# Patient Record
Sex: Male | Born: 1985 | Race: White | Hispanic: No | Marital: Married | Smoking: Never smoker
Health system: Southern US, Community
[De-identification: ages and names within clinical notes are randomized; demographics above are authoritative.]

## PROBLEM LIST (undated history)

## (undated) DIAGNOSIS — K649 Unspecified hemorrhoids: Secondary | ICD-10-CM

## (undated) DIAGNOSIS — I10 Essential (primary) hypertension: Secondary | ICD-10-CM

## (undated) DIAGNOSIS — N200 Calculus of kidney: Secondary | ICD-10-CM

## (undated) HISTORY — PX: VASECTOMY: SHX75

## (undated) HISTORY — PX: LITHOTRIPSY: SUR834

## (undated) HISTORY — PX: MULTIPLE TOOTH EXTRACTIONS: SHX2053

---

## 2013-05-30 ENCOUNTER — Encounter (HOSPITAL_BASED_OUTPATIENT_CLINIC_OR_DEPARTMENT_OTHER): Payer: Self-pay | Admitting: *Deleted

## 2013-05-30 ENCOUNTER — Emergency Department (HOSPITAL_BASED_OUTPATIENT_CLINIC_OR_DEPARTMENT_OTHER): Payer: BC Managed Care – PPO

## 2013-05-30 ENCOUNTER — Emergency Department (HOSPITAL_BASED_OUTPATIENT_CLINIC_OR_DEPARTMENT_OTHER)
Admission: EM | Admit: 2013-05-30 | Discharge: 2013-05-30 | Disposition: A | Discharge status: Home / Self Care | Payer: BC Managed Care – PPO | Attending: Emergency Medicine | Admitting: Emergency Medicine

## 2013-05-30 DIAGNOSIS — R220 Localized swelling, mass and lump, head: Secondary | ICD-10-CM | POA: Insufficient documentation

## 2013-05-30 DIAGNOSIS — H53149 Visual discomfort, unspecified: Secondary | ICD-10-CM | POA: Insufficient documentation

## 2013-05-30 DIAGNOSIS — R51 Headache: Secondary | ICD-10-CM | POA: Insufficient documentation

## 2013-05-30 DIAGNOSIS — J019 Acute sinusitis, unspecified: Secondary | ICD-10-CM | POA: Insufficient documentation

## 2013-05-30 DIAGNOSIS — H5789 Other specified disorders of eye and adnexa: Secondary | ICD-10-CM | POA: Insufficient documentation

## 2013-05-30 MED ORDER — TRAMADOL HCL 50 MG PO TABS: 50.0000 mg | ORAL_TABLET | Freq: Four times a day (QID) | ORAL | Status: DC | PRN

## 2013-05-30 MED ORDER — AMOXICILLIN-POT CLAVULANATE 875-125 MG PO TABS: 1.0000 | ORAL_TABLET | Freq: Two times a day (BID) | ORAL | Status: DC

## 2013-05-30 NOTE — ED Provider Notes (Signed)
History    This chart was scribed for Glenn Jakes, MD by Quintella Reichert, ED scribe.  This patient was seen in room MH03/MH03 and the patient's care was started at 3:26 PM.   CSN: [id number]  Arrival date & time 05/30/13  1419      Chief Complaint  Patient presents with  . Eye Pain     Patient is a 27 y.o. male presenting with eye pain. The history is provided by the patient. No language interpreter was used.  Eye Pain This is a new problem. The current episode started more than 2 days ago. The problem occurs constantly. Associated symptoms include headaches. Pertinent negatives include no chest pain, no abdominal pain and no shortness of breath. Nothing aggravates the symptoms. Nothing relieves the symptoms. Treatments tried: ibuprofen. The treatment provided no relief.    HPI Comments: Glenn Johnson is a 27 y.o. male who presents to the Emergency Department complaining of constant, moderate left eye pain that began 2-3 days ago, with accompanying left forehead tenderness, mild photophobia, and intermittent mild headache.  Pt describes pain as "like someone is poking it from behind the eye," at a severity of 7/10.  He denies injury to the eye or head, or any other illness or injury that may have caused pain.  He also notes that left forehead is tender to light touch.   He states that pain initially presented with a headache and he thought that eye pain was associated, but that headache resolved with ibuprofen while eye pain persisted.  He states that he woke up this morning with visible drooping and swelling to the left eyelid.  He also notes mild photophobia in the eye, with associated mild discharge when exposed to sun.  Pt went to [facility] this morning and had eye numbed and stained, and was informed that cornea was normal and advised to come to ED for further evaluation.  Pt does not wear glasses or contacts.  He notes that he takes allergy medication for seasonal allergies but  presently denies nasal or sinus congestion.  Pt denies fever, chills, visual disturbances, cough, rhinorrhea, CP, SOB, difficulty breathing, abdominal pain, nausea, emesis, diarrhea, neck pain or stiffness, back pain, dysuria, swelling in legs, rash, or any other associated symptoms.  Pt denies h/o bleeding easily.  He notes that he had teeth extracted in January 2014 and that one tooth was in his left nasal cavity.     History reviewed. No pertinent past medical history.  History reviewed. No pertinent past surgical history.  No family history on file.  History  Substance Use Topics  . Smoking status: Never Smoker   . Smokeless tobacco: Never Used  . Alcohol Use: Not on file      Review of Systems  Constitutional: Negative for fever and chills.  HENT: Positive for facial swelling (left eyelid). Negative for rhinorrhea, neck pain and neck stiffness.   Eyes: Positive for photophobia, pain and discharge (when exposed to sun). Negative for visual disturbance.  Respiratory: Negative for cough and shortness of breath.   Cardiovascular: Negative for chest pain and leg swelling.  Gastrointestinal: Negative for nausea, vomiting, abdominal pain and diarrhea.  Genitourinary: Negative for dysuria.  Musculoskeletal: Negative for back pain.  Skin: Negative for rash.  Neurological: Positive for headaches.  Psychiatric/Behavioral: Negative for confusion.    Allergies  Review of patient's allergies indicates no known allergies.  Home Medications   Current Outpatient Rx  Name  Route  Sig  Dispense  Refill  . cetirizine (ZYRTEC) 10 MG tablet   Oral   Take 10 mg by mouth daily.         Marland Kitchen amoxicillin-clavulanate (AUGMENTIN) 875-125 MG per tablet   Oral   Take 1 tablet by mouth every 12 (twelve) hours.   20 tablet   0   . traMADol (ULTRAM) 50 MG tablet   Oral   Take 1 tablet (50 mg total) by mouth every 6 (six) hours as needed for pain.   15 tablet   0     BP 152/75  Pulse 79   Temp(Src) 98.3 F (36.8 C) (Oral)  Resp 18  Ht 5\' 6"  (1.676 m)  Wt 305 lb (138.347 kg)  BMI 49.25 kg/m2  SpO2 97%  Physical Exam  Nursing note and vitals reviewed. Constitutional: He is oriented to person, place, and time.  HENT:  Head: Normocephalic and atraumatic.  No maxillary sinus tenderness Left frontal sinus tenderness  Eyes: Conjunctivae and EOM are normal. Pupils are equal, round, and reactive to light. Right eye exhibits no discharge. Left eye exhibits no discharge. No scleral icterus.  Sclera are clear.  Eyes track appropriately. Anterior chamber and pupil reaction are normal on the right side Anterior chamber and pupil reaction are normal on the left side  Neck: Normal range of motion. Neck supple. No tracheal deviation present.  Cardiovascular: Normal rate and regular rhythm.   No murmur heard. Pulmonary/Chest: Effort normal and breath sounds normal. No respiratory distress. He has no wheezes. He has no rales.  Abdominal: Soft. There is no tenderness.  Lymphadenopathy:    He has no cervical adenopathy.  Neurological: He is alert and oriented to person, place, and time. No cranial nerve deficit.  Skin: Skin is warm and dry. No rash noted.  Psychiatric: He has a normal mood and affect. His behavior is normal.    ED Course  Procedures (including critical care time)  DIAGNOSTIC STUDIES: Oxygen Saturation is 97% on room air, normal by my interpretation.    COORDINATION OF CARE: 3:34 PM-Informed pt that symptoms are likely due to frontal sinus.  Discussed treatment plan which includes CT-scan with pt at bedside and pt agreed to plan.      Labs Reviewed - No data to display Ct Head Wo Contrast  05/30/2013   *RADIOLOGY REPORT*  Clinical Data:  Headache with left facial pressure and eye swelling.  No acute injury.  CT HEAD WITHOUT CONTRAST CT MAXILLOFACIAL WITHOUT CONTRAST  Technique:  Multidetector CT imaging of the head and maxillofacial structures were performed  using the standard protocol without intravenous contrast. Multiplanar CT image reconstructions of the maxillofacial structures were also generated.  Comparison:   None.  CT HEAD  Findings: There is no evidence of acute intracranial hemorrhage, mass lesion, brain edema or extra-axial fluid collection.  The ventricles and subarachnoid spaces are appropriately sized for age. There is no CT evidence of acute cortical infarction.  The left maxillary, ethmoid and frontal sinuses are opacified without air fluid levels.  The mastoids and middle ears are clear. The calvarium is intact.  IMPRESSION:  1.  No acute intracranial findings. 2.  Left paranasal sinus opacification - see below.  CT MAXILLOFACIAL  Findings:   As above, there is near complete opacification of the left frontal, anterior ethmoid and maxillary sinuses.  There is thickening in the left middle turbinate.  The sphenoid and right paranasal sinuses are clear.  There are no air-fluid levels.  There is an osseous  defect in the alveolar ridge adjacent to absent left maxillary first and second bicuspids (teeth 12 and 13). There appear to be some to fragments displaced into the inferior aspect of the maxillary sinus.  No other significant periodontal disease is identified.  There is no evidence of acute fracture or dislocation.  No orbital soft tissue swelling or globe abnormality is identified.  IMPRESSION:  1.  Opacification of the left maxillary, ethmoid and frontal sinuses adjacent to an osseous defect in the left maxillary alveolar ridge. The left maxillary first and second bicuspids are absent - question previous tooth removal. Findings are suspicious for odontogenic sinusitis. 2.  No orbital abnormalities identified.   Original Report Authenticated By: Carey Bullocks, M.D.   Ct Maxillofacial Wo Cm  05/30/2013   *RADIOLOGY REPORT*  Clinical Data:  Headache with left facial pressure and eye swelling.  No acute injury.  CT HEAD WITHOUT CONTRAST CT  MAXILLOFACIAL WITHOUT CONTRAST  Technique:  Multidetector CT imaging of the head and maxillofacial structures were performed using the standard protocol without intravenous contrast. Multiplanar CT image reconstructions of the maxillofacial structures were also generated.  Comparison:   None.  CT HEAD  Findings: There is no evidence of acute intracranial hemorrhage, mass lesion, brain edema or extra-axial fluid collection.  The ventricles and subarachnoid spaces are appropriately sized for age. There is no CT evidence of acute cortical infarction.  The left maxillary, ethmoid and frontal sinuses are opacified without air fluid levels.  The mastoids and middle ears are clear. The calvarium is intact.  IMPRESSION:  1.  No acute intracranial findings. 2.  Left paranasal sinus opacification - see below.  CT MAXILLOFACIAL  Findings:   As above, there is near complete opacification of the left frontal, anterior ethmoid and maxillary sinuses.  There is thickening in the left middle turbinate.  The sphenoid and right paranasal sinuses are clear.  There are no air-fluid levels.  There is an osseous defect in the alveolar ridge adjacent to absent left maxillary first and second bicuspids (teeth 12 and 13). There appear to be some to fragments displaced into the inferior aspect of the maxillary sinus.  No other significant periodontal disease is identified.  There is no evidence of acute fracture or dislocation.  No orbital soft tissue swelling or globe abnormality is identified.  IMPRESSION:  1.  Opacification of the left maxillary, ethmoid and frontal sinuses adjacent to an osseous defect in the left maxillary alveolar ridge. The left maxillary first and second bicuspids are absent - question previous tooth removal. Findings are suspicious for odontogenic sinusitis. 2.  No orbital abnormalities identified.   Original Report Authenticated By: Carey Bullocks, M.D.     1. Acute sinusitis       MDM  Clinically  patient's symptoms were highly suspicious for left frontal sinusitis. CT scan shows left paranasal sinusitis to include the frontal area. Clinically the patient has no anterior eye pain anterior chamber was normal at the urgent care they ruled out any corneal abrasion. Patient's extraocular muscles were intact no evidence of orbital cellulitis. No redness around the eye. Will treat as if it's a moderate to severe sinusitis with antibiotics for 10 days. Symptoms not consistent with acute glaucoma.     I personally performed the services described in this documentation, which was scribed in my presence. The recorded information has been reviewed and is accurate.     Glenn Jakes, MD 05/30/13 [phone]

## 2013-05-30 NOTE — ED Notes (Signed)
C/o pain behind left eye x 3 days- seen at [facility] today and had exam with blacklight- sent here for further eval

## 2017-05-02 ENCOUNTER — Encounter: Payer: Self-pay | Admitting: *Deleted

## 2017-05-02 ENCOUNTER — Emergency Department
Admission: EM | Admit: 2017-05-02 | Discharge: 2017-05-02 | Disposition: A | Discharge status: Home / Self Care | Payer: BLUE CROSS/BLUE SHIELD | Source: Home / Self Care | Attending: Family Medicine | Admitting: Family Medicine

## 2017-05-02 ENCOUNTER — Emergency Department (INDEPENDENT_AMBULATORY_CARE_PROVIDER_SITE_OTHER): Payer: BLUE CROSS/BLUE SHIELD

## 2017-05-02 DIAGNOSIS — N433 Hydrocele, unspecified: Secondary | ICD-10-CM | POA: Diagnosis not present

## 2017-05-02 DIAGNOSIS — N5089 Other specified disorders of the male genital organs: Secondary | ICD-10-CM

## 2017-05-02 DIAGNOSIS — N50812 Left testicular pain: Secondary | ICD-10-CM

## 2017-05-02 DIAGNOSIS — N451 Epididymitis: Secondary | ICD-10-CM

## 2017-05-02 HISTORY — DX: Calculus of kidney: N20.0

## 2017-05-02 LAB — POCT URINALYSIS DIP (MANUAL ENTRY)
Bilirubin, UA: NEGATIVE
Blood, UA: NEGATIVE
Glucose, UA: NEGATIVE mg/dL
Leukocytes, UA: NEGATIVE
Nitrite, UA: NEGATIVE
Spec Grav, UA: 1.02 (ref 1.010–1.025)
Urobilinogen, UA: 0.2 E.U./dL
pH, UA: 6.5 (ref 5.0–8.0)

## 2017-05-02 MED ORDER — IBUPROFEN 800 MG PO TABS
800.0000 mg | ORAL_TABLET | Freq: Once | ORAL | Status: AC
  Administered 2017-05-02: 800 mg via ORAL

## 2017-05-02 MED ORDER — CEFTRIAXONE SODIUM 1 G IJ SOLR
1.0000 g | Freq: Once | INTRAMUSCULAR | Status: AC
  Administered 2017-05-02: 1 g via INTRAMUSCULAR

## 2017-05-02 MED ORDER — DOXYCYCLINE HYCLATE 100 MG PO CAPS: 100.0000 mg | ORAL_CAPSULE | Freq: Two times a day (BID) | ORAL | 0 refills | Status: DC

## 2017-05-02 NOTE — ED Provider Notes (Signed)
CSN: [id number]     Arrival date & time 05/02/17  0912 History   First MD Initiated Contact with Patient 05/02/17 [phone]     Chief Complaint  Patient presents with  . Groin Swelling  . Pelvic Pain   (Consider location/radiation/quality/duration/timing/severity/associated sxs/prior Treatment) HPI  Glenn Johnson is a 31 y.o. male presenting to UC with c/o gradually worsening Left side testicular pain and swelling for about 3 days.  He also felt a knot in his testicle.  Associated intermittent Left pelvic pain radiating to his Left flank.  He had a vasectomy in 01/2017 and a renal stent placed followed by lithotripsy last month for a Right sided kidney stone.  He reports making a full recovery after those procedures, no complications. He does not known where current symptoms came from. No recent heavy lifting. No hx of hernias. Denies concern for STIs. Sitting or certain positions and touch make the pain worse.    Past Medical History:  Diagnosis Date  . Kidney stone    Past Surgical History:  Procedure Laterality Date  . VASECTOMY     Family History  Problem Relation Age of Onset  . Hypertension Mother    Social History  Substance Use Topics  . Smoking status: Never Smoker  . Smokeless tobacco: Never Used  . Alcohol use Not on file    Review of Systems  Constitutional: Negative for chills and fever.  Gastrointestinal: Positive for abdominal pain (Left lower ). Negative for diarrhea, nausea and vomiting.  Genitourinary: Positive for flank pain (Left, mild intermittent), scrotal swelling and testicular pain (Left side). Negative for decreased urine volume, discharge, dysuria, frequency, genital sores, hematuria, penile pain, penile swelling and urgency.    Allergies  Patient has no known allergies.  Home Medications   Prior to Admission medications   Medication Sig Start Date End Date Taking? Authorizing Provider  cetirizine (ZYRTEC) 10 MG tablet Take 10 mg by mouth daily.     Historical Provider, MD  doxycycline (VIBRAMYCIN) 100 MG capsule Take 1 capsule (100 mg total) by mouth 2 (two) times daily. One po bid x 10 days 05/02/17   Junius Finner, PA-C   Meds Ordered and Administered this Visit   Medications  ibuprofen (ADVIL,MOTRIN) tablet 800 mg (800 mg Oral Given 05/02/17 0953)  cefTRIAXone (ROCEPHIN) injection 1 g (1 g Intramuscular Given 05/02/17 1110)    BP 140/81 (BP Location: Left Arm)   Pulse 97   Temp 98.5 F (36.9 C) (Oral)   Resp 16   Wt (!) 320 lb (145.2 kg)   SpO2 98%   BMI 51.65 kg/m  No data found.   Physical Exam  Constitutional: He is oriented to person, place, and time. He appears well-developed and well-nourished.  HENT:  Head: Normocephalic and atraumatic.  Eyes: EOM are normal.  Neck: Normal range of motion.  Cardiovascular: Normal rate.   Pulmonary/Chest: Effort normal.  Genitourinary: Penis normal. Left testis shows mass, swelling ( mild) and tenderness. No discharge found.  Genitourinary Comments: Chaperoned exam.  Musculoskeletal: Normal range of motion.  Neurological: He is alert and oriented to person, place, and time.  Skin: Skin is warm and dry.  Psychiatric: He has a normal mood and affect. His behavior is normal.  Nursing note and vitals reviewed.   Urgent Care Course     Procedures (including critical care time)  Labs Review Labs Reviewed  POCT URINALYSIS DIP (MANUAL ENTRY) - Abnormal; Notable for the following:       Result  Value   Ketones, POC UA small (15) (*)    Protein Ur, POC trace (*)    All other components within normal limits    Imaging Review [place] Scrotum  Result Date: 05/02/2017 CLINICAL DATA:  31 year old presenting with a 3 day history of left scrotal pain and swelling. Prior vasectomy in February, 2018. EXAM: SCROTAL ULTRASOUND DOPPLER ULTRASOUND OF THE TESTICLES TECHNIQUE: Complete ultrasound examination of the testicles, epididymis, and other scrotal structures was performed. Color and spectral  Doppler ultrasound were also utilized to evaluate blood flow to the testicles. COMPARISON:  None. FINDINGS: Right testicle Measurements: Approximately 4.1 x 2.3 x 2.7 cm. No testicular mass. Testicular microlithiasis. Left testicle Measurements: Approximately 3.8 x 2.4 x 2.8 cm. No testicular mass. Testicular microlithiasis. Right epididymis: Diffusely enlarged with mild hyperemia on color Doppler evaluation. Left epididymis: Diffusely enlarged with hyperemia on color Doppler evaluation. Hydrocele:  Moderate sized left hydrocele and small right hydrocele. Varicocele:  None visualized. Pulsed Doppler interrogation of both testes demonstrates normal low resistance arterial and venous waveforms bilaterally. IMPRESSION: 1. Bilateral epididymitis, left greater than right. 2. Bilateral hydroceles, moderate sized on the left and small on the right. 3. Bilateral testicular microlithiasis. Current literature suggests that testicular microlithiasis is not a significant independent risk factor for development of testicular carcinoma, and that follow up imaging is not warranted in the absence of other risk factors. Monthly testicular self-examination and annual physical exams are considered appropriate surveillance. If patient has other risk factors for testicular carcinoma, then referral to Urology should be considered. (Reference: DeCastro, et al.: A 5-Year Follow up Study of Asymptomatic Men with Testicular Microlithiasis. J Urol 2008; 179:1420-1423.) Electronically Signed   By: Hulan Saashomas  Lawrence M.D.   On: 05/02/2017 10:51   [place] Art/ven Flow Abd Pelv Doppler  Result Date: 05/02/2017 CLINICAL DATA:  31 year old presenting with a 3 day history of left scrotal pain and swelling. Prior vasectomy in February, 2018. EXAM: SCROTAL ULTRASOUND DOPPLER ULTRASOUND OF THE TESTICLES TECHNIQUE: Complete ultrasound examination of the testicles, epididymis, and other scrotal structures was performed. Color and spectral Doppler ultrasound  were also utilized to evaluate blood flow to the testicles. COMPARISON:  None. FINDINGS: Right testicle Measurements: Approximately 4.1 x 2.3 x 2.7 cm. No testicular mass. Testicular microlithiasis. Left testicle Measurements: Approximately 3.8 x 2.4 x 2.8 cm. No testicular mass. Testicular microlithiasis. Right epididymis: Diffusely enlarged with mild hyperemia on color Doppler evaluation. Left epididymis: Diffusely enlarged with hyperemia on color Doppler evaluation. Hydrocele:  Moderate sized left hydrocele and small right hydrocele. Varicocele:  None visualized. Pulsed Doppler interrogation of both testes demonstrates normal low resistance arterial and venous waveforms bilaterally. IMPRESSION: 1. Bilateral epididymitis, left greater than right. 2. Bilateral hydroceles, moderate sized on the left and small on the right. 3. Bilateral testicular microlithiasis. Current literature suggests that testicular microlithiasis is not a significant independent risk factor for development of testicular carcinoma, and that follow up imaging is not warranted in the absence of other risk factors. Monthly testicular self-examination and annual physical exams are considered appropriate surveillance. If patient has other risk factors for testicular carcinoma, then referral to Urology should be considered. (Reference: DeCastro, et al.: A 5-Year Follow up Study of Asymptomatic Men with Testicular Microlithiasis. J Urol 2008; 179:1420-1423.) Electronically Signed   By: Hulan Saashomas  Lawrence M.D.   On: 05/02/2017 10:51      MDM   1. Bilateral epididymitis   2. Left testicular pain   3. Hydrocele in adult   4. Testicular microlithiasis  Pt c/o Left side testicular pain with swelling.  U/S c/w bilateral epididymitis, hydrocele, and testicular microlithiasis.   Rocephin 1g IM in UC Rx: doxycycline 100mg  BID for 10 days Encouraged f/u with his urologist given recent procedure.    Junius Finner, PA-C 05/02/17 1123

## 2017-05-02 NOTE — ED Triage Notes (Signed)
Patient c/o gradually worsening left testicular pain and swelling x 3 days. He believes there is a knot in the testicle. He later developed intermittent left pelvic pain radiating to his left flank. He had a vasectomy in 01/2027 and a lithotripsy last month. Taken IBF

## 2017-05-04 ENCOUNTER — Telehealth: Payer: Self-pay | Admitting: Emergency Medicine

## 2017-05-04 NOTE — Telephone Encounter (Signed)
Pt states that he is feeling a lot better.  Will continue with current regimen, any further problems, will f/u w/PCP.  TMartin,CMA

## 2017-07-14 ENCOUNTER — Emergency Department (INDEPENDENT_AMBULATORY_CARE_PROVIDER_SITE_OTHER)
Admission: EM | Admit: 2017-07-14 | Discharge: 2017-07-14 | Disposition: A | Discharge status: Home / Self Care | Payer: BLUE CROSS/BLUE SHIELD | Source: Home / Self Care | Attending: Family Medicine | Admitting: Family Medicine

## 2017-07-14 DIAGNOSIS — Z113 Encounter for screening for infections with a predominantly sexual mode of transmission: Secondary | ICD-10-CM

## 2017-07-14 NOTE — ED Provider Notes (Signed)
[facility]    CSN: [id number] Arrival date & time: 07/14/17  0848     History   Chief Complaint Chief Complaint  Patient presents with  . STI testing    HPI Glenn Johnson is a 31 y.o. male.   Patient presents for STD testing.  He assymptomatic and denies possible exposure.  He has never had testing, and never had an STD.   The history is provided by the patient.    Past Medical History:  Diagnosis Date  . Kidney stone     There are no active problems to display for this patient.   Past Surgical History:  Procedure Laterality Date  . LITHOTRIPSY    . MULTIPLE TOOTH EXTRACTIONS    . VASECTOMY         Home Medications    Prior to Admission medications   Medication Sig Start Date End Date Taking? Authorizing Provider  cetirizine (ZYRTEC) 10 MG tablet Take 10 mg by mouth daily.    [provider]  doxycycline (VIBRAMYCIN) 100 MG capsule Take 1 capsule (100 mg total) by mouth 2 (two) times daily. One po bid x 10 days 05/02/17   Lurene ShadowPhelps, Erin O, PA-C    Family History Family History  Problem Relation Age of Onset  . Hypertension Mother   . Hypertension Father     Social History Social History  Substance Use Topics  . Smoking status: Never Smoker  . Smokeless tobacco: Never Used  . Alcohol use Not on file     Allergies   Other   Review of Systems Review of Systems  Constitutional: Negative for activity change, appetite change, chills, diaphoresis, fatigue and fever.  HENT: Negative.   Eyes: Negative.   Respiratory: Negative.   Cardiovascular: Negative.   Gastrointestinal: Negative.   Genitourinary: Negative for discharge, dysuria, frequency, genital sores, hematuria, penile pain, scrotal swelling, testicular pain and urgency.  Musculoskeletal: Negative for arthralgias and joint swelling.  Skin: Negative for rash.  All other systems reviewed and are negative.    Physical Exam Triage Vital Signs ED Triage Vitals  [07/14/17 0907]  Enc Vitals Group     BP 113/77     Pulse Rate 85     Resp      Temp 98.5 F (36.9 C)     Temp Source Oral     SpO2 97 %     Weight (!) 316 lb (143.3 kg)     Height 5\' 6"  (1.676 m)     Head Circumference      Peak Flow      Pain Score      Pain Loc      Pain Edu?      Excl. in GC?    No data found.   Updated Vital Signs BP 113/77 (BP Location: Left Arm)   Pulse 85   Temp 98.5 F (36.9 C) (Oral)   Ht 5\' 6"  (1.676 m)   Wt (!) 316 lb (143.3 kg)   SpO2 97%   BMI 51.00 kg/m   Visual Acuity Right Eye Distance:   Left Eye Distance:   Bilateral Distance:    Right Eye Near:   Left Eye Near:    Bilateral Near:     Physical Exam Nursing notes and Vital Signs reviewed. Appearance:  Patient appears stated age, and in no acute distress.    Eyes:  Pupils are equal, round, and reactive to light and accomodation.  Extraocular movement is intact.  Conjunctivae are  not inflamed   Lungs:   Normal respiration Heart:  Normal rate. Skin:  No rash present.     UC Treatments / Results  Labs (all labs ordered are listed, but only abnormal results are displayed) Labs Reviewed  GC/CHLAMYDIA PROBE AMP  HIV ANTIBODY (ROUTINE TESTING)  RPR  HSV(HERPES SIMPLEX VRS) I + II AB-IGG    EKG  EKG Interpretation None       Radiology No results found.  Procedures Procedures (including critical care time)  Medications Ordered in UC Medications - No data to display   Initial Impression / Assessment and Plan / UC Course  I have reviewed the triage vital signs and the nursing notes.  Pertinent labs & imaging results that were available during my care of the patient were reviewed by me and considered in my medical decision making (see chart for details).    GC/chlamydia, RPR, HIV antibody, HSV antibodies pending.  Final Clinical Impressions(s) / UC Diagnoses   Final diagnoses:  Screening for STD (sexually transmitted disease)    New Prescriptions New  Prescriptions   No medications on file     Lattie Haw, MD 07/14/17 [phone]

## 2017-07-14 NOTE — ED Triage Notes (Signed)
Pt is not having any symptoms, and no concerns, Just has never had testing done, and would like to have STI test.

## 2017-07-14 NOTE — ED Notes (Signed)
Number to call---[phone] Password---love pain

## 2017-07-15 ENCOUNTER — Telehealth: Payer: Self-pay | Admitting: *Deleted

## 2017-07-15 LAB — HIV ANTIBODY (ROUTINE TESTING W REFLEX): HIV 1&2 Ab, 4th Generation: NONREACTIVE

## 2017-07-15 LAB — HSV(HERPES SIMPLEX VRS) I + II AB-IGG
HSV 1 Glycoprotein G Ab, IgG: <0.9 Index (ref <0.90)
HSV 2 Glycoprotein G Ab, IgG: <0.9 Index (ref <0.90)

## 2017-07-15 LAB — GC/CHLAMYDIA PROBE AMP
CT PROBE, AMP APTIMA: NOT DETECTED
GC Probe RNA: NOT DETECTED

## 2017-07-15 LAB — RPR

## 2017-07-15 NOTE — Telephone Encounter (Signed)
Callback: patient stated password. Results given.

## 2017-11-14 ENCOUNTER — Encounter: Payer: Self-pay | Admitting: Emergency Medicine

## 2017-11-14 ENCOUNTER — Other Ambulatory Visit: Payer: Self-pay

## 2017-11-14 ENCOUNTER — Emergency Department
Admission: EM | Admit: 2017-11-14 | Discharge: 2017-11-14 | Disposition: A | Discharge status: Home / Self Care | Payer: BLUE CROSS/BLUE SHIELD | Source: Home / Self Care | Attending: Family Medicine | Admitting: Family Medicine

## 2017-11-14 DIAGNOSIS — K644 Residual hemorrhoidal skin tags: Secondary | ICD-10-CM

## 2017-11-14 HISTORY — DX: Unspecified hemorrhoids: K64.9

## 2017-11-14 MED ORDER — HYDROCORTISONE ACETATE 25 MG RE SUPP: 25.0000 mg | Freq: Two times a day (BID) | RECTAL | 0 refills | Status: DC

## 2017-11-14 NOTE — Discharge Instructions (Signed)
°  You may try the suppository prescribed today as well as Sitz baths at home to help with your discomfort.   Over the counter you may find temporary relief with Preparation-H or witch hazel wipes after having a bowel movement.  You may also want to try a stool softener if you feel you are straining to have a bowel movement. Be sure to stay well hydrated.

## 2017-11-14 NOTE — ED Provider Notes (Signed)
[facility]    CSN: [id number] Arrival date & time: 11/14/17  1025     History   Chief Complaint Chief Complaint  Patient presents with  . Hemorrhoids    HPI Glenn Johnson is a 31 y.o. male.   HPI Glenn Johnson is a 31 y.o. male presenting to UC with c/o about 3 days of worsening pain and irritation of his hemorrhoids.  He reports having a small hemorrhoid last year, he saw his PCP who prescribed topical hydrocortisone cream 2.5%.  He states the initial hemorrhoid resolved within 1-2 days but current hemorrhoid only seems to be getting larger and more painful.  He does not feel like he is straining to have a BM but states he only has a BM every other day due to a weight loss diet he is on. denies bleeding. Denies abdominal pain, back pain, fever or chills. Denies urinary symptoms. He was unable to f/u with his PCP today.     Past Medical History:  Diagnosis Date  . Hemorrhoids   . Kidney stone     There are no active problems to display for this patient.   Past Surgical History:  Procedure Laterality Date  . LITHOTRIPSY    . MULTIPLE TOOTH EXTRACTIONS    . VASECTOMY         Home Medications    Prior to Admission medications   Medication Sig Start Date End Date Taking? Authorizing Provider  hydrocortisone (ANUSOL-HC) 2.5 % rectal cream Place 1 application 2 (two) times daily rectally.   Yes [provider]  cetirizine (ZYRTEC) 10 MG tablet Take 10 mg by mouth daily.    [provider]  doxycycline (VIBRAMYCIN) 100 MG capsule Take 1 capsule (100 mg total) by mouth 2 (two) times daily. One po bid x 10 days 05/02/17   Lurene ShadowPhelps, Joniyah Mallinger O, PA-C  hydrocortisone (ANUSOL-HC) 25 MG suppository Place 1 suppository (25 mg total) 2 (two) times daily rectally. For 7 days 11/14/17   Rolla PlatePhelps, Melissaann Dizdarevic O, PA-C    Family History Family History  Problem Relation Age of Onset  . Hypertension Mother   . Hypertension Father     Social History Social History    Tobacco Use  . Smoking status: Never Smoker  . Smokeless tobacco: Never Used  Substance Use Topics  . Alcohol use: Not on file  . Drug use: No     Allergies   Other   Review of Systems Review of Systems  Constitutional: Negative for chills and fever.  Gastrointestinal: Positive for rectal pain. Negative for abdominal pain, blood in stool, constipation, diarrhea, nausea and vomiting.  Musculoskeletal: Negative for back pain.     Physical Exam Triage Vital Signs ED Triage Vitals  Enc Vitals Group     BP 11/14/17 1043 127/81     Pulse Rate 11/14/17 1043 74     Resp 11/14/17 1043 16     Temp 11/14/17 1043 98.5 F (36.9 C)     Temp Source 11/14/17 1043 Oral     SpO2 11/14/17 1043 99 %     Weight 11/14/17 1044 282 lb (127.9 kg)     Height 11/14/17 1044 5\' 6"  (1.676 m)     Head Circumference --      Peak Flow --      Pain Score 11/14/17 1044 5     Pain Loc --      Pain Edu? --      Excl. in GC? --  No data found.  Updated Vital Signs BP 127/81 (BP Location: Right Arm)   Pulse 74   Temp 98.5 F (36.9 C) (Oral)   Resp 16   Ht 5\' 6"  (1.676 m)   Wt 282 lb (127.9 kg)   SpO2 99%   BMI 45.52 kg/m   Visual Acuity Right Eye Distance:   Left Eye Distance:   Bilateral Distance:    Right Eye Near:   Left Eye Near:    Bilateral Near:     Physical Exam  Constitutional: He is oriented to person, place, and time. He appears well-developed and well-nourished. No distress.  HENT:  Head: Normocephalic and atraumatic.  Eyes: EOM are normal.  Neck: Normal range of motion.  Cardiovascular: Normal rate.  Pulmonary/Chest: Effort normal.  Genitourinary:  Genitourinary Comments: Chaperoned exam. 2cm external tender hemorrhoid. No bleeding. Tenderness during digital exam.   Musculoskeletal: Normal range of motion.  Neurological: He is alert and oriented to person, place, and time.  Skin: Skin is warm and dry. He is not diaphoretic.  Psychiatric: He has a normal mood  and affect. His behavior is normal.  Nursing note and vitals reviewed.    UC Treatments / Results  Labs (all labs ordered are listed, but only abnormal results are displayed) Labs Reviewed - No data to display  EKG  EKG Interpretation None       Radiology No results found.  Procedures Procedures (including critical care time)  Medications Ordered in UC Medications - No data to display   Initial Impression / Assessment and Plan / UC Course  I have reviewed the triage vital signs and the nursing notes.  Pertinent labs & imaging results that were available during my care of the patient were reviewed by me and considered in my medical decision making (see chart for details).     Hx and exam c/w external hemorrhoid. Due to worsening symptoms, question if internal hemorrhoid also present Will have pt start using hydrocortisone suppositories and sitz baths.  F/u with PCP, may need referral to general surgery.   Final Clinical Impressions(s) / UC Diagnoses   Final diagnoses:  External hemorrhoid    ED Discharge Orders        Ordered    hydrocortisone (ANUSOL-HC) 25 MG suppository  2 times daily     11/14/17 1103       Controlled Substance Prescriptions [place] Controlled Substance Registry consulted? Not Applicable   Lurene Shadowhelps, Missy Baksh O, PA-C 11/14/17 1210

## 2017-11-14 NOTE — ED Triage Notes (Signed)
Patient reports hemorrhoidal pain for past few days; no bleeding; having regular BMs; doing weight loss program; using rx given to him for previous episode same problem.

## 2018-11-13 ENCOUNTER — Other Ambulatory Visit: Payer: Self-pay

## 2018-11-13 ENCOUNTER — Emergency Department
Admission: EM | Admit: 2018-11-13 | Discharge: 2018-11-13 | Disposition: A | Discharge status: Home / Self Care | Payer: BLUE CROSS/BLUE SHIELD | Source: Home / Self Care | Attending: Family Medicine | Admitting: Family Medicine

## 2018-11-13 ENCOUNTER — Encounter: Payer: Self-pay | Admitting: *Deleted

## 2018-11-13 DIAGNOSIS — J069 Acute upper respiratory infection, unspecified: Secondary | ICD-10-CM | POA: Diagnosis not present

## 2018-11-13 DIAGNOSIS — B9789 Other viral agents as the cause of diseases classified elsewhere: Secondary | ICD-10-CM

## 2018-11-13 MED ORDER — BENZONATATE 100 MG PO CAPS: 100.0000 mg | ORAL_CAPSULE | Freq: Three times a day (TID) | ORAL | 0 refills | Status: DC

## 2018-11-13 MED ORDER — AZITHROMYCIN 250 MG PO TABS: 250.0000 mg | ORAL_TABLET | Freq: Every day | ORAL | 0 refills | Status: DC

## 2018-11-13 NOTE — Discharge Instructions (Signed)
°  Your symptoms are likely due to a virus such as the common cold, however, if you developing worsening chest congestion with shortness of breath, persistent fever for 3 days, or symptoms not improving in 4-5 days, you may fill the antibiotic (azithromycin).  If you do fill the antibiotic,  please take antibiotics as prescribed and be sure to complete entire course even if you start to feel better to ensure infection does not come back.  Please follow up with family medicine in 1 week if not improving.

## 2018-11-13 NOTE — ED Triage Notes (Signed)
Pt c/o hoarseness, productive cough, and nasal congestion x 2 days. he has taken Nyquil and Cepacol.  Daughter with Laryngitis.

## 2018-11-13 NOTE — ED Provider Notes (Signed)
[facility]    CSN: [id number] Arrival date & time: 11/13/18  1345     History   Chief Complaint Chief Complaint  Patient presents with  . Cough    HPI Glenn Johnson is a 32 y.o. male.   HPI  Glenn Johnson is a 32 y.o. male presenting to UC with c/o hoarse voice and 2 days of productive cough with nasal congestion. Sore chest from coughing.  His daughter is currently being treated with prednisone for laryngitis.  He has taken Nyquil and Cepacol with mild relief. Denies fever, chills, n/v/d.  No hx of asthma.    Past Medical History:  Diagnosis Date  . Hemorrhoids   . Kidney stone     There are no active problems to display for this patient.   Past Surgical History:  Procedure Laterality Date  . LITHOTRIPSY    . MULTIPLE TOOTH EXTRACTIONS    . VASECTOMY         Home Medications    Prior to Admission medications   Medication Sig Start Date End Date Taking? Authorizing Provider  azithromycin (ZITHROMAX) 250 MG tablet Take 1 tablet (250 mg total) by mouth daily. Take first 2 tablets together, then 1 every day until finished. 11/13/18   Lurene Shadow, PA-C  benzonatate (TESSALON) 100 MG capsule Take 1-2 capsules (100-200 mg total) by mouth every 8 (eight) hours. 11/13/18   Lurene Shadow, PA-C    Family History Family History  Problem Relation Age of Onset  . Hypertension Mother   . Hypertension Father     Social History Social History   Tobacco Use  . Smoking status: Never Smoker  . Smokeless tobacco: Never Used  Substance Use Topics  . Alcohol use: Not on file  . Drug use: No     Allergies   Other   Review of Systems Review of Systems  Constitutional: Negative for chills and fever.  HENT: Positive for congestion, postnasal drip, sore throat and voice change. Negative for ear pain and trouble swallowing.   Respiratory: Positive for cough and chest tightness. Negative for shortness of breath.   Cardiovascular: Negative for chest  pain and palpitations.  Gastrointestinal: Negative for abdominal pain, diarrhea, nausea and vomiting.  Musculoskeletal: Negative for arthralgias, back pain and myalgias.  Skin: Negative for rash.     Physical Exam Triage Vital Signs ED Triage Vitals [11/13/18 1423]  Enc Vitals Group     BP (!) 140/98     Pulse Rate 74     Resp 18     Temp 98 F (36.7 C)     Temp Source Oral     SpO2 98 %     Weight 299 lb (135.6 kg)     Height 5\' 6"  (1.676 m)     Head Circumference      Peak Flow      Pain Score 0     Pain Loc      Pain Edu?      Excl. in GC?    No data found.  Updated Vital Signs BP (!) 140/98 (BP Location: Right Arm)   Pulse 74   Temp 98 F (36.7 C) (Oral)   Resp 18   Ht 5\' 6"  (1.676 m)   Wt 299 lb (135.6 kg)   SpO2 98%   BMI 48.26 kg/m   Visual Acuity Right Eye Distance:   Left Eye Distance:   Bilateral Distance:    Right Eye Near:   Left  Eye Near:    Bilateral Near:     Physical Exam  Constitutional: He is oriented to person, place, and time. He appears well-developed and well-nourished. No distress.  HENT:  Head: Normocephalic and atraumatic.  Right Ear: Tympanic membrane normal.  Left Ear: Tympanic membrane normal.  Nose: Nose normal.  Mouth/Throat: Uvula is midline, oropharynx is clear and moist and mucous membranes are normal.  Eyes: EOM are normal.  Neck: Normal range of motion. Neck supple.  Cardiovascular: Normal rate and regular rhythm.  Pulmonary/Chest: Effort normal. No stridor. No respiratory distress. He has wheezes (faint diffuse expiratory wheeze). He has no rales.  Musculoskeletal: Normal range of motion.  Neurological: He is alert and oriented to person, place, and time.  Skin: Skin is warm and dry. He is not diaphoretic.  Psychiatric: He has a normal mood and affect. His behavior is normal.  Nursing note and vitals reviewed.    UC Treatments / Results  Labs (all labs ordered are listed, but only abnormal results are  displayed) Labs Reviewed - No data to display  EKG None  Radiology No results found.  Procedures Procedures (including critical care time)  Medications Ordered in UC Medications - No data to display  Initial Impression / Assessment and Plan / UC Course  I have reviewed the triage vital signs and the nursing notes.  Pertinent labs & imaging results that were available during my care of the patient were reviewed by me and considered in my medical decision making (see chart for details).     Hx and exam c/w viral illness, encouraged symptomatic treatment. Prescription to hold with expiration date for azithromycin. Pt to fill if persistent fever develops or not improving in 1 week.   Final Clinical Impressions(s) / UC Diagnoses   Final diagnoses:  Viral URI with cough     Discharge Instructions      Your symptoms are likely due to a virus such as the common cold, however, if you developing worsening chest congestion with shortness of breath, persistent fever for 3 days, or symptoms not improving in 4-5 days, you may fill the antibiotic (azithromycin).  If you do fill the antibiotic,  please take antibiotics as prescribed and be sure to complete entire course even if you start to feel better to ensure infection does not come back.  Please follow up with family medicine in 1 week if not improving.     ED Prescriptions    Medication Sig Dispense Auth. Provider   benzonatate (TESSALON) 100 MG capsule Take 1-2 capsules (100-200 mg total) by mouth every 8 (eight) hours. 21 capsule Doroteo GlassmanPhelps, Tajh Livsey O, PA-C   azithromycin (ZITHROMAX) 250 MG tablet Take 1 tablet (250 mg total) by mouth daily. Take first 2 tablets together, then 1 every day until finished. 6 tablet Lurene ShadowPhelps, Virgil Slinger O, PA-C     Controlled Substance Prescriptions [place] Controlled Substance Registry consulted? Not Applicable   Rolla Platehelps, Saidi Santacroce O, PA-C 11/13/18 1805

## 2020-01-15 ENCOUNTER — Other Ambulatory Visit: Payer: Self-pay

## 2020-01-15 ENCOUNTER — Emergency Department (INDEPENDENT_AMBULATORY_CARE_PROVIDER_SITE_OTHER): Payer: BC Managed Care – PPO

## 2020-01-15 ENCOUNTER — Emergency Department (INDEPENDENT_AMBULATORY_CARE_PROVIDER_SITE_OTHER)
Admission: EM | Admit: 2020-01-15 | Discharge: 2020-01-15 | Disposition: A | Discharge status: Home / Self Care | Payer: BC Managed Care – PPO | Source: Home / Self Care | Attending: Family Medicine | Admitting: Family Medicine

## 2020-01-15 ENCOUNTER — Encounter: Payer: Self-pay | Admitting: Emergency Medicine

## 2020-01-15 DIAGNOSIS — R05 Cough: Secondary | ICD-10-CM | POA: Diagnosis not present

## 2020-01-15 DIAGNOSIS — R0602 Shortness of breath: Secondary | ICD-10-CM | POA: Diagnosis not present

## 2020-01-15 DIAGNOSIS — J209 Acute bronchitis, unspecified: Secondary | ICD-10-CM | POA: Diagnosis not present

## 2020-01-15 HISTORY — DX: Essential (primary) hypertension: I10

## 2020-01-15 MED ORDER — GUAIFENESIN-CODEINE 100-10 MG/5ML PO SOLN: ORAL | 0 refills | Status: DC

## 2020-01-15 MED ORDER — PREDNISONE 20 MG PO TABS: ORAL_TABLET | ORAL | 0 refills | Status: DC

## 2020-01-15 MED ORDER — AZITHROMYCIN 250 MG PO TABS: 250.0000 mg | ORAL_TABLET | Freq: Every day | ORAL | 0 refills | Status: DC

## 2020-01-15 MED ORDER — METHYLPREDNISOLONE SODIUM SUCC 125 MG IJ SOLR
80.0000 mg | Freq: Once | INTRAMUSCULAR | Status: AC
  Administered 2020-01-15: 11:00:00 80 mg via INTRAMUSCULAR

## 2020-01-15 NOTE — ED Provider Notes (Signed)
[facility]    CSN: [id number] Arrival date & time: 01/15/20  0853      History   Chief Complaint Chief Complaint  Patient presents with  . Shortness of Breath  . Cough    HPI Glenn Johnson is a 34 y.o. male.   Patient developed cough, wheezing, and shortness of breath without fever one week ago.  He arranged a Tele-Doc evaluation and was prescribed Tessalon Perles and an albuterol inhaler.  He has not improved.  Last night he had difficulty sleeping because of cough and wheezing.  He denies history of asthma.  He denies pleuritic pain and changes in taste/smell.  He states that he often coughs until he gags.  The history is provided by the patient.    Past Medical History:  Diagnosis Date  . Hemorrhoids   . Hypertension   . Kidney stone     Problems:  Essential hypertension (on no meds presently)   Past Surgical History:  Procedure Laterality Date  . LITHOTRIPSY    . MULTIPLE TOOTH EXTRACTIONS    . VASECTOMY         Home Medications    Prior to Admission medications   Medication Sig Start Date End Date Taking? Authorizing Provider  albuterol (VENTOLIN HFA) 108 (90 Base) MCG/ACT inhaler Inhale into the lungs every 6 (six) hours as needed for wheezing or shortness of breath.   Yes [provider]  azithromycin (ZITHROMAX) 250 MG tablet Take 1 tablet (250 mg total) by mouth daily. Take first 2 tablets together, then 1 every day until finished. 01/15/20   Lattie Haw, MD  benzonatate (TESSALON) 100 MG capsule Take 1-2 capsules (100-200 mg total) by mouth every 8 (eight) hours. 11/13/18   Lurene Shadow, PA-C  guaiFENesin-codeine 100-10 MG/5ML syrup Take 61mL by mouth at bedtime as needed for cough. 01/15/20   Lattie Haw, MD  predniSONE (DELTASONE) 20 MG tablet Take one tab by mouth twice daily for 4 days, then one daily for 3 days. Take with food. 01/15/20   Lattie Haw, MD    Family History Family History  Problem Relation Age  of Onset  . Hypertension Mother   . Hypertension Father     Social History Social History   Tobacco Use  . Smoking status: Never Smoker  . Smokeless tobacco: Never Used  Substance Use Topics  . Alcohol use: Not on file  . Drug use: No     Allergies   Bee stings   Review of Systems Review of Systems No sore throat + cough No pleuritic pain + wheezing No nasal congestion No post-nasal drainage No sinus pain/pressure No itchy/red eyes No earache No hemoptysis + SOB No fever/chills No nausea No vomiting No abdominal pain No diarrhea No urinary symptoms No skin rash + fatigue No myalgias No headache    Physical Exam Triage Vital Signs ED Triage Vitals  Enc Vitals Group     BP 01/15/20 0943 (!) 175/99     Pulse Rate 01/15/20 0943 82     Resp 01/15/20 0943 18     Temp 01/15/20 0943 98.4 F (36.9 C)     Temp Source 01/15/20 0943 Oral     SpO2 01/15/20 0943 94 %     Weight 01/15/20 0945 295 lb (133.8 kg)     Height 01/15/20 0945 5\' 6"  (1.676 m)     Head Circumference --      Peak Flow --  Pain Score 01/15/20 0944 1     Pain Loc --      Pain Edu? --      Excl. in GC? --    No data found.  Updated Vital Signs BP (!) 175/99 (BP Location: Right Wrist)   Pulse 82   Temp 98.4 F (36.9 C) (Oral)   Resp 18   Ht 5\' 6"  (1.676 m)   Wt 133.8 kg   SpO2 94%   BMI 47.61 kg/m   Visual Acuity Right Eye Distance:   Left Eye Distance:   Bilateral Distance:    Right Eye Near:   Left Eye Near:    Bilateral Near:     Physical Exam Nursing notes and Vital Signs reviewed. Appearance:  Patient appears stated age, and in no acute distress Eyes:  Pupils are equal, round, and reactive to light and accomodation.  Extraocular movement is intact.  Conjunctivae are not inflamed  Ears:  Canals normal.  Tympanic membranes normal.  Nose:  Mildly congested turbinates.  No sinus tenderness.  Pharynx:  Normal; moist mucous membranes  Neck:  Supple.  No adenopathy.    Lungs:  Diffuse bilateral wheezes all fields.  No rales or rhonchi.  Breath sounds are equal.  Moving air well. Heart:  Regular rate and rhythm without murmurs, rubs, or gallops.  Abdomen:  Nontender without masses or hepatosplenomegaly.  Bowel sounds are present.  No CVA or flank tenderness.  Extremities:  No edema.  Skin:  No rash present.   UC Treatments / Results  Labs (all labs ordered are listed, but only abnormal results are displayed) Labs Reviewed - No data to display  EKG   Radiology DG Chest 2 View  Result Date: 01/15/2020 CLINICAL DATA:  34 year old male with history of cough and shortness of breath for 1 week. EXAM: CHEST - 2 VIEW COMPARISON:  No priors. FINDINGS: Lung volumes are normal. No consolidative airspace disease. No pleural effusions. No pneumothorax. No pulmonary nodule or mass noted. Pulmonary vasculature and the cardiomediastinal silhouette are within normal limits. IMPRESSION: No radiographic evidence of acute cardiopulmonary disease. Electronically Signed   By: 35 M.D.   On: 01/15/2020 10:16    Procedures Procedures (including critical care time)  Medications Ordered in UC Medications  methylPREDNISolone sodium succinate (SOLU-MEDROL) 125 mg/2 mL injection 80 mg (has no administration in time range)    Initial Impression / Assessment and Plan / UC Course  I have reviewed the triage vital signs and the nursing notes.  Pertinent labs & imaging results that were available during my care of the patient were reviewed by me and considered in my medical decision making (see chart for details).    Negative chest x-ray reassuring. Administered Solumedrol 125mg  IM, then begin prednisone burst/taper. Begin Z-pak for atypical coverage. Rx for Robitussin AC for night time cough. Controlled Substance Prescriptions I have consulted the [place] Controlled Substances Registry for this patient, and feel the risk/benefit ratio today is favorable for  proceeding with this prescription for a controlled substance.   COVID19 send out.   Final Clinical Impressions(s) / UC Diagnoses   Final diagnoses:  Acute bronchitis with bronchospasm     Discharge Instructions     Begin prednisone Sunday 01/16/20. Take plain guaifenesin (1200mg  extended release tabs such as Mucinex) twice daily, with plenty of water, for cough and congestion.  May add Pseudoephedrine (30mg , one or two every 4 to 6 hours) for sinus congestion.  Get adequate rest.   Try warm  salt water gargles for sore throat.  Stop all antihistamines for now, and other non-prescription cough/cold preparations. Stop Tessalon.  May continue albuterol inhaler as needed.  Isolate yourself until COVID-19 test result is available.   If your COVID19 test is positive, then you are infected with the novel coronavirus and could give the virus to others.  Please continue isolation at home for at least 10 days since the start of your symptoms.  Once you complete your 10 day quarantine, you may return to normal activities as long as you've not had a fever for over 24 hours (without taking fever reducing medicine) and your symptoms are improving. Please continue good preventive care measures, including:  frequent hand-washing, avoid touching your face, cover coughs/sneezes, stay out of crowds and keep a 6 foot distance from others.  Go to the nearest hospital emergency room if fever/cough/breathlessness are severe or illness seems like a threat to life.     ED Prescriptions    Medication Sig Dispense Auth. Provider   azithromycin (ZITHROMAX) 250 MG tablet Take 1 tablet (250 mg total) by mouth daily. Take first 2 tablets together, then 1 every day until finished. 6 tablet Kandra Nicolas, MD   predniSONE (DELTASONE) 20 MG tablet Take one tab by mouth twice daily for 4 days, then one daily for 3 days. Take with food. 11 tablet Kandra Nicolas, MD   guaiFENesin-codeine 100-10 MG/5ML syrup Take 59mL by  mouth at bedtime as needed for cough. 50 mL Kandra Nicolas, MD        Kandra Nicolas, MD 01/21/20 [phone]

## 2020-01-15 NOTE — Discharge Instructions (Addendum)
Begin prednisone Sunday 01/16/20. Take plain guaifenesin (1200mg  extended release tabs such as Mucinex) twice daily, with plenty of water, for cough and congestion.  May add Pseudoephedrine (30mg , one or two every 4 to 6 hours) for sinus congestion.  Get adequate rest.   Try warm salt water gargles for sore throat.  Stop all antihistamines for now, and other non-prescription cough/cold preparations. Stop Tessalon.  May continue albuterol inhaler as needed.  Isolate yourself until COVID-19 test result is available.   If your COVID19 test is positive, then you are infected with the novel coronavirus and could give the virus to others.  Please continue isolation at home for at least 10 days since the start of your symptoms.  Once you complete your 10 day quarantine, you may return to normal activities as long as you've not had a fever for over 24 hours (without taking fever reducing medicine) and your symptoms are improving. Please continue good preventive care measures, including:  frequent hand-washing, avoid touching your face, cover coughs/sneezes, stay out of crowds and keep a 6 foot distance from others.  Go to the nearest hospital emergency room if fever/cough/breathlessness are severe or illness seems like a threat to life.

## 2020-01-15 NOTE — ED Triage Notes (Signed)
Patient did Tele-Doc appt. One week ago for shortness of breath and cough; no fever; no aches. He was rxd tessalon and albuterol inhaler. He has not had COVID testing. He is not improving and last night it was difficult to sleep; inhaler gives very brief relief. No history of asthma. He is an Programmer, applications. He has not had influenza vacc this season.

## 2020-01-16 LAB — NOVEL CORONAVIRUS, NAA: SARS-CoV-2, NAA: NOT DETECTED

## 2020-07-26 ENCOUNTER — Ambulatory Visit (INDEPENDENT_AMBULATORY_CARE_PROVIDER_SITE_OTHER): Payer: Self-pay

## 2020-07-26 ENCOUNTER — Other Ambulatory Visit: Payer: Self-pay

## 2020-07-26 ENCOUNTER — Other Ambulatory Visit: Payer: Self-pay | Admitting: Physician Assistant

## 2020-07-26 DIAGNOSIS — M25562 Pain in left knee: Secondary | ICD-10-CM

## 2020-12-21 ENCOUNTER — Other Ambulatory Visit: Payer: Self-pay

## 2020-12-21 ENCOUNTER — Emergency Department
Admission: EM | Admit: 2020-12-21 | Discharge: 2020-12-21 | Disposition: A | Discharge status: Home / Self Care | Payer: BC Managed Care – PPO | Source: Home / Self Care | Attending: Family Medicine | Admitting: Family Medicine

## 2020-12-21 DIAGNOSIS — R52 Pain, unspecified: Secondary | ICD-10-CM | POA: Diagnosis not present

## 2020-12-21 DIAGNOSIS — Z20822 Contact with and (suspected) exposure to covid-19: Secondary | ICD-10-CM

## 2020-12-21 NOTE — Discharge Instructions (Signed)
[place] [id number] [phone]   12/21/20   To Whom It May Concern:   Glenn Johnson  DOB: 1986-10-07   The above individual has been tested for COVID-19 today. Test results are usually received within 24 hours from the time of the test.  Based on Centers for Disease Control and Prevention (CDC) guidelines, he or she is required to stay home and self-isolate until the test results are returned.   1. If results are positive, he or she will need to stay at home and self-isolate until the following conditions are met: at least 10 days since symptoms onset or date of positive test results AND 3 days fever free without antipyretics (Tylenol or Ibuprofen) AND improvement in respiratory symptoms.  2. If results are negative, he or she may resume normal activities if no symptoms are present or once feeling better.   Sincerely,      Vanessa Kick MD

## 2020-12-21 NOTE — ED Provider Notes (Signed)
  [facility]   [id number] 12/21/20 Arrival Time: 7124  ASSESSMENT & PLAN:  1. Generalized body aches   2. Close exposure to COVID-19 virus     Suspicious for COVID. Discussed. COVID-19 testing sent. See letter/work note in AVS. OTC symptom care as needed.     Follow-up Information    [facility].   Specialty: Urgent Care Why: As needed. Contact information: [address] [place] [address] [phone]              Reviewed expectations re: course of current medical issues. Questions answered. Outlined signs and symptoms indicating need for more acute intervention. Understanding verbalized. After Visit Summary given.   SUBJECTIVE: History from: patient. Glenn Johnson is a 34 y.o. male who presents with worries regarding COVID-19. Known COVID-19 contact: daughter tested + today. Recent travel: none. Reports: headache, runny nose, body aches; 1-2 days. Denies: fever and difficulty breathing. Normal PO intake without n/v/d.    OBJECTIVE:  Vitals:   12/21/20 0952 12/21/20 0955  BP: (!) 151/94   Pulse: 89   Resp: 17   Temp: 98.1 F (36.7 C)   TempSrc: Oral   SpO2: 99%   Weight:  (!) 138.3 kg  Height:  5\' 6"  (1.676 m)    General appearance: alert; no distress; appears fatigued Eyes: EOMI; conjunctiva normal HENT: [place]; AT; with nasal congestion Neck: supple  Lungs: speaks full sentences without difficulty; unlabored Extremities: no edema Skin: warm and dry Neurologic: normal gait Psychological: alert and cooperative; normal mood and affect  Labs: Labs Reviewed  NOVEL CORONAVIRUS, NAA     Allergies  Allergen Reactions  . Other Anaphylaxis    Bees- epi pen    Past Medical History:  Diagnosis Date  . Hemorrhoids   . Hypertension   . Kidney stone    Social History   Socioeconomic History  . Marital status: Married    Spouse name: Not on file  . Number of children: Not on  file  . Years of education: Not on file  . Highest education level: Not on file  Occupational History  . Not on file  Tobacco Use  . Smoking status: Never Smoker  . Smokeless tobacco: Never Used  Vaping Use  . Vaping Use: Never used  Substance and Sexual Activity  . Alcohol use: Not on file  . Drug use: No  . Sexual activity: Not on file  Other Topics Concern  . Not on file  Social History Narrative  . Not on file   Social Determinants of Health   Financial Resource Strain: Not on file  Food Insecurity: Not on file  Transportation Needs: Not on file  Physical Activity: Not on file  Stress: Not on file  Social Connections: Not on file  Intimate Partner Violence: Not on file   Family History  Problem Relation Age of Onset  . Hypertension Mother   . Hypertension Father    Past Surgical History:  Procedure Laterality Date  . LITHOTRIPSY    . MULTIPLE TOOTH EXTRACTIONS    . , MD 12/21/20 1013

## 2020-12-21 NOTE — ED Triage Notes (Signed)
Pt c/o headache, runny nose and bodyaches. No known covid exposure. Daughter being seen at her doc for similar sxs. Has not had covid vaccinations. Aleve prn.

## 2020-12-24 LAB — NOVEL CORONAVIRUS, NAA: SARS-CoV-2, NAA: DETECTED — AB

## 2020-12-25 ENCOUNTER — Telehealth: Payer: Self-pay | Admitting: Infectious Diseases

## 2020-12-25 NOTE — Telephone Encounter (Signed)
Called to Discuss with patient about Covid symptoms and the use of the monoclonal antibody infusion for those with mild to moderate Covid symptoms and at a high risk of hospitalization.     Pt appears to qualify for this infusion due to co-morbid conditions and/or a member of an at-risk group in accordance with the FDA Emergency Use Authorization.    Unable to reach pt - LVM and sent mychart   Symptom onset: 12/22 Vaccinated: unclear  Qualified for Infusion: BMI > 35    Rexene Alberts, MSN, NP-C [facility] for Infectious Disease [facility]  [email or web address] Pager: [phone] Office: [phone] [facility] Main Line: [phone]

## 2020-12-28 ENCOUNTER — Ambulatory Visit (HOSPITAL_COMMUNITY)
Admission: RE | Admit: 2020-12-28 | Discharge: 2020-12-28 | Disposition: A | Discharge status: Home / Self Care | Payer: BC Managed Care – PPO | Source: Ambulatory Visit | Attending: Pulmonary Disease | Admitting: Pulmonary Disease

## 2020-12-28 ENCOUNTER — Other Ambulatory Visit: Payer: Self-pay | Admitting: Unknown Physician Specialty

## 2020-12-28 ENCOUNTER — Telehealth: Payer: Self-pay | Admitting: Unknown Physician Specialty

## 2020-12-28 DIAGNOSIS — U071 COVID-19: Secondary | ICD-10-CM

## 2020-12-28 MED ORDER — ALBUTEROL SULFATE HFA 108 (90 BASE) MCG/ACT IN AERS: 2.0000 | INHALATION_SPRAY | Freq: Once | RESPIRATORY_TRACT | Status: DC | PRN

## 2020-12-28 MED ORDER — SODIUM CHLORIDE 0.9 % IV SOLN: Freq: Once | INTRAVENOUS | Status: AC

## 2020-12-28 MED ORDER — DIPHENHYDRAMINE HCL 50 MG/ML IJ SOLN: 50.0000 mg | Freq: Once | INTRAMUSCULAR | Status: DC | PRN

## 2020-12-28 MED ORDER — EPINEPHRINE 0.3 MG/0.3ML IJ SOAJ: 0.3000 mg | Freq: Once | INTRAMUSCULAR | Status: DC | PRN

## 2020-12-28 MED ORDER — FAMOTIDINE IN NACL 20-0.9 MG/50ML-% IV SOLN: 20.0000 mg | Freq: Once | INTRAVENOUS | Status: DC | PRN

## 2020-12-28 MED ORDER — METHYLPREDNISOLONE SODIUM SUCC 125 MG IJ SOLR: 125.0000 mg | Freq: Once | INTRAMUSCULAR | Status: DC | PRN

## 2020-12-28 MED ORDER — SODIUM CHLORIDE 0.9 % IV SOLN: INTRAVENOUS | Status: DC | PRN

## 2020-12-28 NOTE — Telephone Encounter (Signed)
I connected by phone with Ria Comment on 12/28/2020 at 4:18 PM to discuss the potential use of a new treatment for mild to moderate COVID-19 viral infection in non-hospitalized patients.  This patient is a 34 y.o. male that meets the FDA criteria for Emergency Use Authorization of COVID monoclonal antibody casirivimab/imdevimab, bamlanivimab/etesevimab, or sotrovimab.  Has a (+) direct SARS-CoV-2 viral test result  Has mild or moderate COVID-19   Is NOT hospitalized due to COVID-19  Is within 10 days of symptom onset  Has at least one of the high risk factor(s) for progression to severe COVID-19 and/or hospitalization as defined in EUA.  Specific high risk criteria : BMI > 25   I have spoken and communicated the following to the patient or parent/caregiver regarding COVID monoclonal antibody treatment:  1. FDA has authorized the emergency use for the treatment of mild to moderate COVID-19 in adults and pediatric patients with positive results of direct SARS-CoV-2 viral testing who are 52 years of age and older weighing at least 40 kg, and who are at high risk for progressing to severe COVID-19 and/or hospitalization.  2. The significant known and potential risks and benefits of COVID monoclonal antibody, and the extent to which such potential risks and benefits are unknown.  3. Information on available alternative treatments and the risks and benefits of those alternatives, including clinical trials.  4. Patients treated with COVID monoclonal antibody should continue to self-isolate and use infection control measures (e.g., wear mask, isolate, social distance, avoid sharing personal items, clean and disinfect "high touch" surfaces, and frequent handwashing) according to CDC guidelines.   5. The patient or parent/caregiver has the option to accept or refuse COVID monoclonal antibody treatment.  After reviewing this information with the patient, the patient has agreed to receive one of  the available covid 19 monoclonal antibodies and will be provided an appropriate fact sheet prior to infusion. Gabriel Cirri, NP 12/28/2020 4:18 PM   Sx onset 12/24

## 2020-12-28 NOTE — Progress Notes (Signed)
Patient reviewed Fact Sheet for Patients, Parents, and Caregivers for Emergency Use Authorization (EUA) of Regen-Cov for the Treatment of Coronavirus.  Patient also reviewed and is agreeable to the estimated cost of treatment.  Patient is agreeable to proceed.   

## 2020-12-28 NOTE — Discharge Instructions (Signed)
10 Things You Can Do to Manage Your COVID-19 Symptoms at Home If you have possible or confirmed COVID-19: 1. Stay home from work and school. And stay away from other public places. If you must go out, avoid using any kind of public transportation, ridesharing, or taxis. 2. Monitor your symptoms carefully. If your symptoms get worse, call your healthcare provider immediately. 3. Get rest and stay hydrated. 4. If you have a medical appointment, call the healthcare provider ahead of time and tell them that you have or may have COVID-19. 5. For medical emergencies, call 911 and notify the dispatch personnel that you have or may have COVID-19. 6. Cover your cough and sneezes with a tissue or use the inside of your elbow. 7. Wash your hands often with soap and water for at least 20 seconds or clean your hands with an alcohol-based hand sanitizer that contains at least 60% alcohol. 8. As much as possible, stay in a specific room and away from other people in your home. Also, you should use a separate bathroom, if available. If you need to be around other people in or outside of the home, wear a mask. 9. Avoid sharing personal items with other people in your household, like dishes, towels, and bedding. 10. Clean all surfaces that are touched often, like counters, tabletops, and doorknobs. Use household cleaning sprays or wipes according to the label instructions. cdc.gov/coronavirus 06/30/2019 This information is not intended to replace advice given to you by your health care provider. Make sure you discuss any questions you have with your health care provider. Document Revised: 12/02/2019 Document Reviewed: 12/02/2019 Elsevier Patient Education  2020 Elsevier Inc. What types of side effects do monoclonal antibody drugs cause?  Common side effects  In general, the more common side effects caused by monoclonal antibody drugs include: . Allergic reactions, such as hives or itching . Flu-like signs and  symptoms, including chills, fatigue, fever, and muscle aches and pains . Nausea, vomiting . Diarrhea . Skin rashes . Low blood pressure   The CDC is recommending patients who receive monoclonal antibody treatments wait at least 90 days before being vaccinated.  Currently, there are no data on the safety and efficacy of mRNA COVID-19 vaccines in persons who received monoclonal antibodies or convalescent plasma as part of COVID-19 treatment. Based on the estimated half-life of such therapies as well as evidence suggesting that reinfection is uncommon in the 90 days after initial infection, vaccination should be deferred for at least 90 days, as a precautionary measure until additional information becomes available, to avoid interference of the antibody treatment with vaccine-induced immune responses. If you have any questions or concerns after the infusion please call the Advanced Practice Provider on call at [phone]. This number is ONLY intended for your use regarding questions or concerns about the infusion post-treatment side-effects.  Please do not provide this number to others for use. For return to work notes please contact your primary care provider.   If someone you know is interested in receiving treatment please have them call the COVID hotline at [phone].   

## 2020-12-28 NOTE — Progress Notes (Signed)
  Diagnosis: COVID-19  Physician:  Dr. Patrick Wright  Procedure: Covid Infusion Clinic Med: casirivimab\imdevimab infusion - Provided patient with casirivimab\imdevimab fact sheet for patients, parents and caregivers prior to infusion.  Complications: No immediate complications noted.  Discharge: Discharged home   Glenn Johnson 12/28/2020   

## 2021-01-03 ENCOUNTER — Telehealth: Payer: Self-pay | Admitting: Family Medicine

## 2021-01-03 NOTE — Telephone Encounter (Signed)
Completed disability paperwork as a courtesy as patient PCP refused to complete. Document scanned to EMR

## 2021-06-30 IMAGING — DX DG CHEST 2V
2 series · 2 of 2 positions shown · non-contrast
Comparison: No priors.

CLINICAL DATA: 33-year-old male with history of cough and shortness
of breath for 1 week.

EXAM:
CHEST - 2 VIEW

[chest pa]
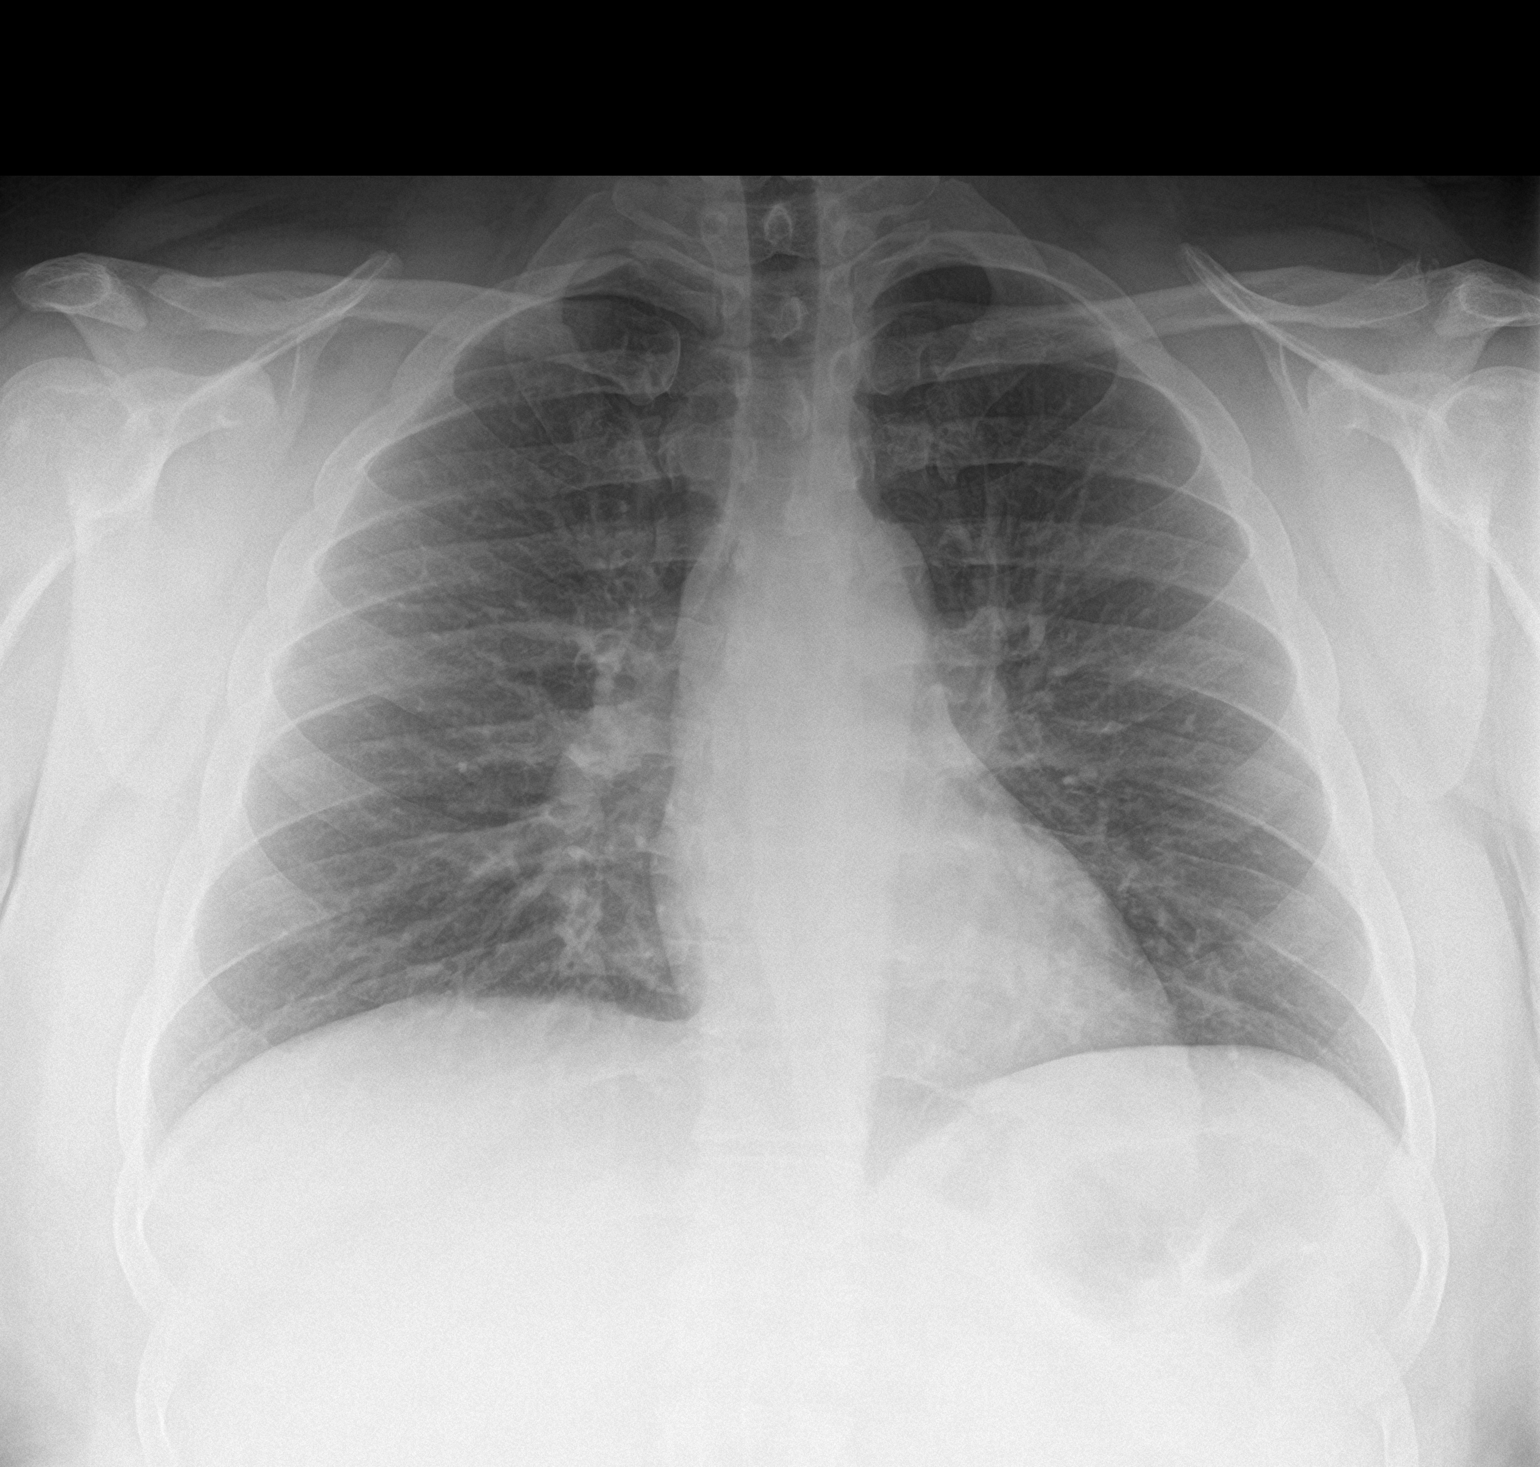

[chest lat]
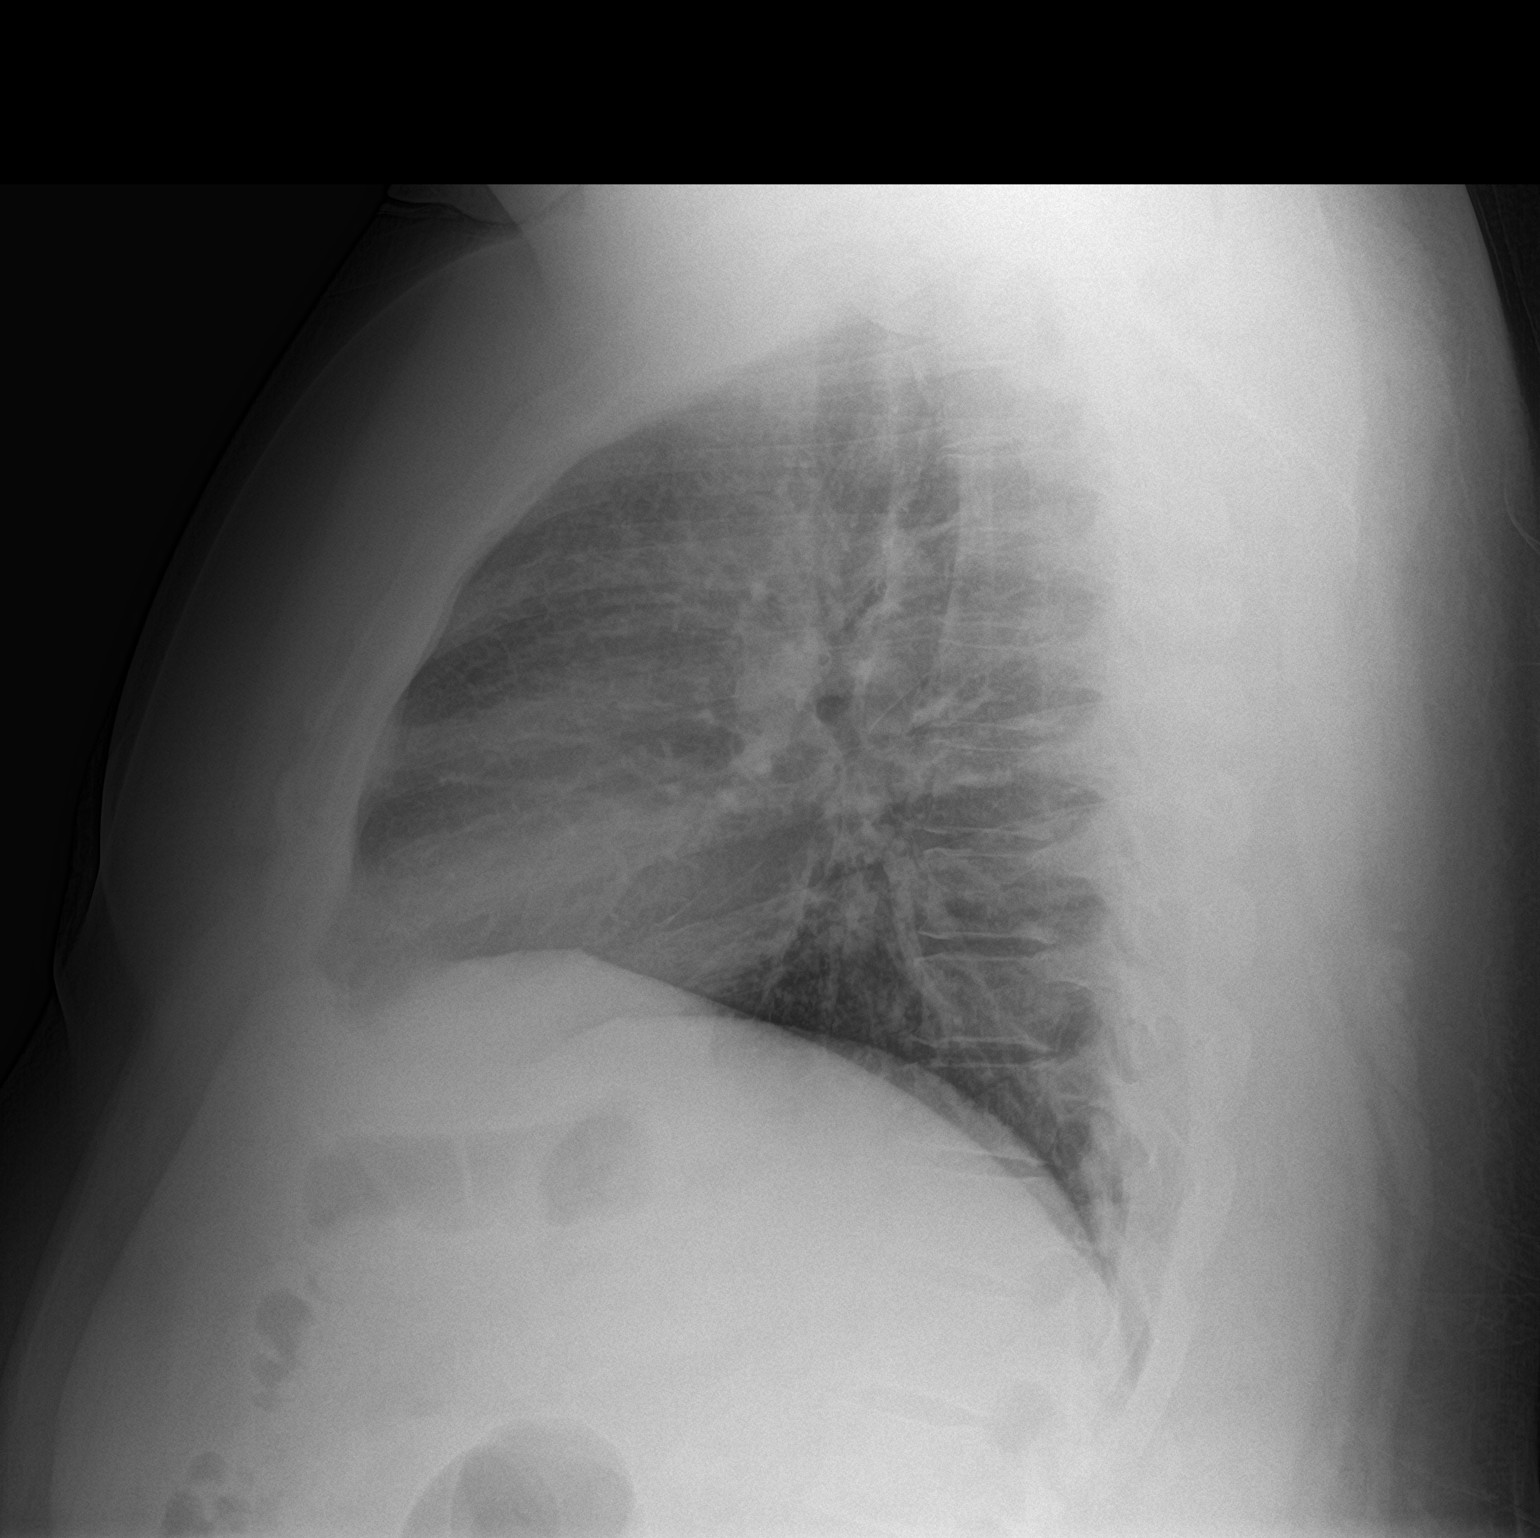

[2 of 2 positions shown; findings below may reference images not displayed]

FINDINGS: Lung volumes are normal. No consolidative airspace disease. No
pleural effusions. No pneumothorax. No pulmonary nodule or mass
noted. Pulmonary vasculature and the cardiomediastinal silhouette
are within normal limits.
IMPRESSION: No radiographic evidence of acute cardiopulmonary disease.

## 2021-08-23 ENCOUNTER — Other Ambulatory Visit: Payer: Self-pay

## 2021-08-23 ENCOUNTER — Emergency Department
Admission: EM | Admit: 2021-08-23 | Discharge: 2021-08-23 | Disposition: A | Discharge status: Home / Self Care | Payer: BC Managed Care – PPO | Source: Home / Self Care | Attending: Family Medicine | Admitting: Family Medicine

## 2021-08-23 ENCOUNTER — Encounter: Payer: Self-pay | Admitting: Emergency Medicine

## 2021-08-23 DIAGNOSIS — T63481A Toxic effect of venom of other arthropod, accidental (unintentional), initial encounter: Secondary | ICD-10-CM

## 2021-08-23 DIAGNOSIS — Z91038 Other insect allergy status: Secondary | ICD-10-CM

## 2021-08-23 MED ORDER — METHYLPREDNISOLONE ACETATE 80 MG/ML IJ SUSP
80.0000 mg | Freq: Once | INTRAMUSCULAR | Status: AC
  Administered 2021-08-23: 80 mg via INTRAMUSCULAR

## 2021-08-23 NOTE — ED Triage Notes (Signed)
Patient c/o that he has a possible insect bite on left ankle for a couple of days.  Some drainage from area, some pain, the area where the bites are really hard.  Denies applying any meds.

## 2021-08-23 NOTE — ED Provider Notes (Signed)
[facility]    CSN: [id number] Arrival date & time: 08/23/21  1227      History   Chief Complaint Chief Complaint  Patient presents with   Insect Bite    HPI Glenn Johnson is a 35 y.o. male.   HPI  Pleasant 35 year old gentleman.  Here with a bite to his left ankle.  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  These caused him to break out in hives.  He has had no trouble breathing with these like anaphylaxis or collapse.  He states with this bite the area has gotten red, and swollen.  The whole ankle is swollen.  The bite itself is weeping yellow fluid.  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 Authorizing Provider  EPINEPHrine 0.3 mg/0.3 mL IJ SOAJ injection Inject into the muscle. 03/28/20  Yes [provider]  albuterol (VENTOLIN HFA) 108 (90 Base) MCG/ACT inhaler Inhale into the lungs every 6 (six) hours as needed for wheezing or shortness of breath.    [provider]    Family History Family History  Problem Relation Age of Onset   Hypertension Mother    Hypertension Father     Social History Social History   Tobacco Use   Smoking status: Never   Smokeless tobacco: Never  Vaping Use   Vaping Use: Never used  Substance Use Topics   Drug use: No     Allergies   Wasp venom   Review of Systems Review of Systems See HPI  Physical Exam Triage Vital Signs ED Triage Vitals  Enc Vitals Group     BP 08/23/21 1239 138/90     Pulse Rate 08/23/21 1239 80      Resp 08/23/21 1239 18     Temp 08/23/21 1239 98.8 F (37.1 C)     Temp Source 08/23/21 1239 Oral     SpO2 08/23/21 1239 97 %     Weight 08/23/21 1240 (!) 305 lb (138.3 kg)     Height 08/23/21 1240 5\' 6"  (1.676 m)     Head Circumference --      Peak Flow --      Pain Score 08/23/21 1240 2     Pain Loc --      Pain Edu? --      Excl. in GC? --    No data found.  Updated Vital Signs BP 138/90 (BP Location: Right Arm)   Pulse 80   Temp 98.8 F (37.1 C) (Oral)   Resp 18   Ht 5\' 6"  (1.676 m)   Wt (!) 138.3 kg   SpO2 97%   BMI 49.23 kg/m       Physical Exam Constitutional:      General: He is not in acute distress.    Appearance: He is well-developed.     Comments: Stocky and muscular.  Mildly overweight  HENT:  Head: Normocephalic and atraumatic.     Mouth/Throat:     Comments: Mask is in place Eyes:     Conjunctiva/sclera: Conjunctivae normal.     Pupils: Pupils are equal, round, and reactive to light.  Cardiovascular:     Rate and Rhythm: Normal rate.  Pulmonary:     Effort: Pulmonary effort is normal. No respiratory distress.     Breath sounds: Normal breath sounds. No wheezing or rales.  Abdominal:     General: There is no distension.     Palpations: Abdomen is soft.  Musculoskeletal:        General: Normal range of motion.     Cervical back: Normal range of motion.  Skin:    General: Skin is warm and dry.     Comments: Left ankle is slightly swollen as of post to the right.  There is a large area of erythema surrounding a smaller ruptured vesicle with serous fluid.  Bite area is indurated.  Neurological:     General: No focal deficit present.     Mental Status: He is alert.  Psychiatric:        Mood and Affect: Mood normal.        Behavior: Behavior normal.     UC Treatments / Results  Labs (all labs ordered are listed, but only abnormal results are displayed) Labs Reviewed - No data to display  EKG   Radiology No results  found.  Procedures Procedures (including critical care time)  Medications Ordered in UC Medications  methylPREDNISolone acetate (DEPO-MEDROL) injection 80 mg (80 mg Intramuscular Given 08/23/21 1302)    Initial Impression / Assessment and Plan / UC Course  I have reviewed the triage vital signs and the nursing notes.  Pertinent labs & imaging results that were available during my care of the patient were reviewed by me and considered in my medical decision making (see chart for details).     Discussed allergic reaction to insect sting.  Treatment.  Prevention Final Clinical Impressions(s) / UC Diagnoses   Final diagnoses:  Local reaction to hymenoptera sting     Discharge Instructions      Antihistamines like Claritin or Zyrtec may help reduce the allergic reaction You have been given a shot of a prednisone medicine to take down the allergic reaction and swelling Return as needed     ED Prescriptions   None    PDMP not reviewed this encounter.   Eustace Moore, MD 08/23/21 [id number]

## 2021-08-23 NOTE — Discharge Instructions (Addendum)
Antihistamines like Claritin or Zyrtec may help reduce the allergic reaction You have been given a shot of a prednisone medicine to take down the allergic reaction and swelling Return as needed

## 2022-03-24 ENCOUNTER — Emergency Department (INDEPENDENT_AMBULATORY_CARE_PROVIDER_SITE_OTHER): Payer: BC Managed Care – PPO

## 2022-03-24 ENCOUNTER — Other Ambulatory Visit: Payer: Self-pay

## 2022-03-24 ENCOUNTER — Emergency Department
Admission: EM | Admit: 2022-03-24 | Discharge: 2022-03-24 | Disposition: A | Discharge status: Home / Self Care | Payer: BC Managed Care – PPO | Source: Home / Self Care

## 2022-03-24 DIAGNOSIS — R059 Cough, unspecified: Secondary | ICD-10-CM

## 2022-03-24 DIAGNOSIS — J40 Bronchitis, not specified as acute or chronic: Secondary | ICD-10-CM | POA: Diagnosis not present

## 2022-03-24 DIAGNOSIS — J309 Allergic rhinitis, unspecified: Secondary | ICD-10-CM | POA: Diagnosis not present

## 2022-03-24 MED ORDER — DOXYCYCLINE HYCLATE 100 MG PO CAPS: 100.0000 mg | ORAL_CAPSULE | Freq: Two times a day (BID) | ORAL | 0 refills | Status: AC

## 2022-03-24 MED ORDER — FEXOFENADINE HCL 180 MG PO TABS: 180.0000 mg | ORAL_TABLET | Freq: Every day | ORAL | 0 refills | Status: DC

## 2022-03-24 MED ORDER — BENZONATATE 200 MG PO CAPS: 200.0000 mg | ORAL_CAPSULE | Freq: Three times a day (TID) | ORAL | 0 refills | Status: AC | PRN

## 2022-03-24 MED ORDER — PREDNISONE 20 MG PO TABS: ORAL_TABLET | ORAL | 0 refills | Status: DC

## 2022-03-24 MED ORDER — PROMETHAZINE-DM 6.25-15 MG/5ML PO SYRP: 5.0000 mL | ORAL_SOLUTION | Freq: Two times a day (BID) | ORAL | 0 refills | Status: DC | PRN

## 2022-03-24 NOTE — ED Provider Notes (Signed)
?[facility] ? ? ? ?CSN: [id number] ?Arrival date & time: 03/24/22  [id number] ? ? ?  ? ?History   ?Chief Complaint ?Chief Complaint  ?Patient presents with  ? Nasal Congestion  ? Cough  ? ? ?HPI ?Issack Bachand is a 36 y.o. male.  ? ?HPI 36 year old male presents with cough, nasal congestion for 4 days.  Reports intermittent wheezing as well.  Patient reports having a virtual visit appointment 4 days ago was advised to take OTC Sudafed and was prescribed Tessalon Perles. ? ?Past Medical History:  ?Diagnosis Date  ? Hemorrhoids   ? Hypertension   ? Kidney stone   ? ? ?Patient Active Problem List  ? Diagnosis Date Noted  ? 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 ? ?Past Surgical History:  ?Procedure Laterality Date  ? LITHOTRIPSY    ? MULTIPLE TOOTH EXTRACTIONS    ? VASECTOMY    ? ? ? ? ? ?Home Medications   ? ?Prior to Admission medications   ?Medication Sig Start Date End Date Taking? Authorizing Provider  ?benzonatate (TESSALON) 200 MG capsule Take 1 capsule (200 mg total) by mouth 3 (three) times daily as needed for up to 7 days for cough. 03/24/22 03/31/22 Yes Eliezer Lofts, FNP  ?doxycycline (VIBRAMYCIN) 100 MG capsule Take 1 capsule (100 mg total) by mouth 2 (two) times daily for 10 days. 03/24/22 04/03/22 Yes Eliezer Lofts, FNP  ?fexofenadine (ALLEGRA ALLERGY) 180 MG tablet Take 1 tablet (180 mg total) by mouth daily for 15 days. 03/24/22 04/08/22 Yes Eliezer Lofts, FNP  ?predniSONE (DELTASONE) 20 MG tablet Take 3 tabs PO daily x 5 days. 03/24/22  Yes Eliezer Lofts, FNP  ?promethazine-dextromethorphan (PROMETHAZINE-DM) 6.25-15 MG/5ML syrup Take 5 mLs by mouth 2 (two) times daily as needed for cough. 03/24/22  Yes Eliezer Lofts, FNP  ?albuterol (VENTOLIN HFA) 108 (90 Base) MCG/ACT inhaler Inhale into the lungs every 6 (six) hours as needed for wheezing or shortness of breath.    [provider]  ?EPINEPHrine 0.3 mg/0.3 mL IJ SOAJ injection Inject into the muscle. 03/28/20   [provider]   ? ? ?Family History ?Family History  ?Problem Relation Age of Onset  ? Hypertension Mother   ? Hypertension Father   ? ? ?Social History ?Social History  ? ?Tobacco Use  ? Smoking status: Never  ? Smokeless tobacco: Never  ?Vaping Use  ? Vaping Use: Never used  ?Substance Use Topics  ? Alcohol use: Not Currently  ?  Comment: occasionally  ? Drug use: No  ? ? ? ?Allergies   ?Wasp venom ? ? ?Review of Systems ?Review of Systems  ?HENT:  Positive for congestion.   ?Respiratory:  Positive for cough.   ?All other systems reviewed and are negative. ? ? ?Physical Exam ?Triage Vital Signs ?ED Triage Vitals  ?Enc Vitals Group  ?   BP 03/24/22 0822 (!) 154/111  ?   Pulse Rate 03/24/22 0822 81  ?   Resp 03/24/22 0822 20  ?   Temp 03/24/22 0822 98.2 ?F (36.8 ?C)  ?   Temp Source 03/24/22 0822 Oral  ?   SpO2 03/24/22 0822 98 %  ?   Weight 03/24/22 0819 (!) 310 lb (140.6 kg)  ?   Height 03/24/22 0819 5\' 6"  (1.676 m)  ?   Head Circumference --   ?   Peak Flow --   ?   Pain Score 03/24/22 0819 0  ?   Pain Loc --   ?  Pain Edu? --   ?   Excl. in [place]? --   ? ?No data found. ? ?Updated Vital Signs ?BP (!) 146/96 (BP Location: Right Arm)   Pulse 81   Temp 98.2 ?F (36.8 ?C) (Oral)   Resp 20   Ht 5\' 6"  (1.676 m)   Wt (!) 310 lb (140.6 kg)   SpO2 98%   BMI 50.04 kg/m?  ? ? ?Physical Exam ?Vitals and nursing note reviewed.  ?Constitutional:   ?   General: He is not in acute distress. ?   Appearance: He is obese. He is not ill-appearing.  ?HENT:  ?   Head: Normocephalic and atraumatic.  ?   Right Ear: Tympanic membrane, ear canal and external ear normal.  ?   Left Ear: Tympanic membrane, ear canal and external ear normal.  ?   Mouth/Throat:  ?   Mouth: Mucous membranes are moist.  ?   Pharynx: Oropharynx is clear.  ?Eyes:  ?   Extraocular Movements: Extraocular movements intact.  ?   Conjunctiva/sclera: Conjunctivae normal.  ?   Pupils: Pupils are equal, round, and reactive to light.  ?Cardiovascular:  ?   Rate and Rhythm: Normal  rate and regular rhythm.  ?   Pulses: Normal pulses.  ?   Heart sounds: Normal heart sounds.  ?Pulmonary:  ?   Effort: Pulmonary effort is normal.  ?   Breath sounds: Wheezing and rhonchi present. No rales.  ?   Comments: Diffuse scattered rhonchi, expiratory wheezes noted throughout, diminished air intake at bases ?Musculoskeletal:     ?   General: Normal range of motion.  ?   Cervical back: Normal range of motion and neck supple. No tenderness.  ?Lymphadenopathy:  ?   Cervical: Cervical adenopathy present.  ?Skin: ?   General: Skin is warm and dry.  ?Neurological:  ?   General: No focal deficit present.  ?   Mental Status: He is alert and oriented to person, place, and time. Mental status is at baseline.  ? ? ? ?UC Treatments / Results  ?Labs ?(all labs ordered are listed, but only abnormal results are displayed) ?Labs Reviewed - No data to display ? ?EKG ? ? ?Radiology ?DG Chest 2 View ? ?Result Date: 03/24/2022 ?CLINICAL DATA:  Cough EXAM: CHEST - 2 VIEW COMPARISON:  01/15/2020 FINDINGS: The heart size and mediastinal contours are within normal limits. No focal airspace consolidation, pleural effusion, or pneumothorax. The visualized skeletal structures are unremarkable. IMPRESSION: No active cardiopulmonary disease. Electronically Signed   By: Davina Poke D.O.   On: 03/24/2022 10:03   ? ?Procedures ?Procedures (including critical care time) ? ?Medications Ordered in UC ?Medications - No data to display ? ?Initial Impression / Assessment and Plan / UC Course  ?I have reviewed the triage vital signs and the nursing notes. ? ?Pertinent labs & imaging results that were available during my care of the patient were reviewed by me and considered in my medical decision making (see chart for details). ? ?  ? ?MDM: 1. Cough-CXR revealed no acute cardiopulmonary process.  Rx'd Doxycycline, Tessalon Perles; 2.  Bronchitis-Rx'd prednisone and Promethazine DM; 3.  Allergic rhinitis-Rx'd Allegra. Advised/informed patient  of chest x-ray results today with hard copy provided with this AVS.  Instructed patient to take medication as directed with food to completion.  Advised patient to take prednisone and Allegra with first dose of Doxycycline for the next 5 of 10 days.  Advised may use Allegra as needed afterwards  for concurrent postnasal drainage/drip.  Advised patient may use Tessalon Perles daily or as needed for cough.  Advised patient may use Promethazine DM in late afternoon or prior to sleep due to sedative effects.  Advised patient not to use Tessalon Perles and Promethazine DM together.  Encouraged patient to increase daily water intake while taking these medications.  Advised patient if symptoms worsen and/or unresolved please follow-up with PCP or here for further evaluation.  Patient discharged home, hemodynamically stable. ?Final Clinical Impressions(s) / UC Diagnoses  ? ?Final diagnoses:  ?Cough, unspecified type  ?Allergic rhinitis, unspecified seasonality, unspecified trigger  ?Bronchitis  ? ? ? ?Discharge Instructions   ? ?  ?Advised/informed patient of chest x-ray results today with hard copy provided with this AVS.  Instructed patient to take medication as directed with food to completion.  Advised patient to take prednisone and Allegra with first dose of Doxycycline for the next 5 of 10 days.  Advised may use Allegra as needed afterwards for concurrent postnasal drainage/drip.  Advised patient may use Tessalon Perles daily or as needed for cough.  Advised patient may use Promethazine DM in late afternoon or prior to sleep due to sedative effects.  Advised patient not to use Tessalon Perles and Promethazine DM together.  Encouraged patient to increase daily water intake while taking these medications.  Advised patient if symptoms worsen and/or unresolved please follow-up with PCP or here for further evaluation. ? ? ? ? ?ED Prescriptions   ? ? Medication Sig Dispense Auth. Provider  ? doxycycline (VIBRAMYCIN) 100 MG  capsule Take 1 capsule (100 mg total) by mouth 2 (two) times daily for 10 days. 20 capsule Eliezer Lofts, FNP  ? predniSONE (DELTASONE) 20 MG tablet Take 3 tabs PO daily x 5 days. 15 tablet Eliezer Lofts, FNP  ? b

## 2022-03-24 NOTE — Discharge Instructions (Addendum)
Advised/informed patient of chest x-ray results today with hard copy provided with this AVS.  Instructed patient to take medication as directed with food to completion.  Advised patient to take prednisone and Allegra with first dose of Doxycycline for the next 5 of 10 days.  Advised may use Allegra as needed afterwards for concurrent postnasal drainage/drip.  Advised patient may use Tessalon Perles daily or as needed for cough.  Advised patient may use Promethazine DM in late afternoon or prior to sleep due to sedative effects.  Advised patient not to use Tessalon Perles and Promethazine DM together.  Encouraged patient to increase daily water intake while taking these medications.  Advised patient if symptoms worsen and/or unresolved please follow-up with PCP or here for further evaluation. ?

## 2022-03-24 NOTE — ED Triage Notes (Signed)
Pt presents to Urgent Care with c/o cough and nasal congestion x 4 days. Also reports wheezing. States he doesn't think he has been febrile. Has not done COVID test. Reports having virtual appt 4 days ago--told to take Sudafed and prescribed Tessalon.  ?

## 2023-03-06 ENCOUNTER — Ambulatory Visit (INDEPENDENT_AMBULATORY_CARE_PROVIDER_SITE_OTHER): Payer: BC Managed Care – PPO

## 2023-03-06 ENCOUNTER — Ambulatory Visit
Admission: EM | Admit: 2023-03-06 | Discharge: 2023-03-06 | Disposition: A | Discharge status: Home / Self Care | Payer: BC Managed Care – PPO | Attending: Family Medicine | Admitting: Family Medicine

## 2023-03-06 DIAGNOSIS — W19XXXA Unspecified fall, initial encounter: Secondary | ICD-10-CM | POA: Diagnosis not present

## 2023-03-06 DIAGNOSIS — M79631 Pain in right forearm: Secondary | ICD-10-CM

## 2023-03-06 DIAGNOSIS — M25521 Pain in right elbow: Secondary | ICD-10-CM

## 2023-03-06 DIAGNOSIS — M79601 Pain in right arm: Secondary | ICD-10-CM | POA: Diagnosis not present

## 2023-03-06 MED ORDER — IBUPROFEN 800 MG PO TABS: 800.0000 mg | ORAL_TABLET | Freq: Three times a day (TID) | ORAL | 0 refills | Status: DC

## 2023-03-06 MED ORDER — ACETAMINOPHEN 500 MG PO TABS
1000.0000 mg | ORAL_TABLET | Freq: Once | ORAL | Status: AC
  Administered 2023-03-06: 1000 mg via ORAL

## 2023-03-06 NOTE — ED Notes (Signed)
Attempted manual BP--unable to auscultate. Dr. Meda Coffee enters room and is aware of BP.

## 2023-03-06 NOTE — Discharge Instructions (Signed)
Wear sling for comfort.  Remove arm from sling at least twice a day and do gentle motion exercises Use ice for 20 minutes every couple of hours Take ibuprofen 3 times a day with food See your orthopedics if you fail to improve by Monday

## 2023-03-06 NOTE — ED Triage Notes (Signed)
Pt presents to Urgent Care with c/o R elbow and upper forearm pain following a fall yesterday. Reports slipping on wet grass and landing on R elbow and forearm. States he also fell and landed in the same position a few days prior. Has not taken PO NSAIDs.

## 2023-03-06 NOTE — ED Provider Notes (Signed)
[facility]    CSN: [id number] Arrival date & time: 03/06/23  0835      History   Chief Complaint Chief Complaint  Patient presents with   Elbow Injury   Arm Injury    HPI [facility] is a 37 y.o. male.   HPI  Patient states that there is a slick area in his yard and from the rain he has fallen.  He is fallen twice in the last couple days.  He landed on his right elbow.  He has pain in his right elbow and forearm.  It kept him awake much of the night.  He has noted pain with forceful grasp or grip of his right hand.  Pain with flexion extension of wrist into the forearm.  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 Authorizing Provider  ibuprofen (ADVIL) 800 MG tablet Take 1 tablet (800 mg total) by mouth 3 (three) times daily. 03/06/23  Yes Raylene Everts, MD  EPINEPHrine 0.3 mg/0.3 mL IJ SOAJ injection Inject into the muscle. 03/28/20   [provider]    Family History Family History  Problem Relation Age of Onset   Hypertension Mother    Hypertension Father     Social History Social History   Tobacco Use   Smoking status: Never   Smokeless tobacco: Never  Vaping Use   Vaping Use: Never used  Substance Use Topics   Alcohol use: Not Currently    Comment: occasionally   Drug use: No     Allergies   Wasp venom   Review of Systems Review of Systems  See hpi Physical Exam Triage Vital Signs ED Triage Vitals  Enc Vitals Group     BP 03/06/23 0949 (!) 159/118     Pulse Rate 03/06/23 0949 83     Resp 03/06/23 0949 20     Temp 03/06/23 0949 98 F (36.7 C)     Temp  src --      SpO2 03/06/23 0949 97 %     Weight 03/06/23 0946 (!) 315 lb (142.9 kg)     Height 03/06/23 0946 '5\' 6"'$  (1.676 m)     Head Circumference --      Peak Flow --      Pain Score 03/06/23 0946 7     Pain Loc --      Pain Edu? --      Excl. in [place]? --    No data found.  Updated Vital Signs BP (!) 158/112 (BP Location: Left Arm)   Pulse 83   Temp 98 F (36.7 C)   Resp 20   Ht '5\' 6"'$  (1.676 m)   Wt (!) 142.9 kg   SpO2 97%   BMI 50.84 kg/m     Physical Exam Constitutional:      General: He is in acute distress.     Appearance: He is well-developed. He is obese.     Comments: UNCOMFORTABLE, HOLDING RIGHT ARM IMMOBILE  HENT:     Head: Normocephalic  and atraumatic.  Eyes:     Conjunctiva/sclera: Conjunctivae normal.     Pupils: Pupils are equal, round, and reactive to light.  Cardiovascular:     Rate and Rhythm: Normal rate.  Pulmonary:     Effort: Pulmonary effort is normal. No respiratory distress.  Abdominal:     General: There is no distension.     Palpations: Abdomen is soft.  Musculoskeletal:        General: Swelling, tenderness and signs of injury present. No deformity.     Right elbow: Decreased range of motion. Tenderness present in olecranon process.     Right forearm: Tenderness present.     Cervical back: Normal range of motion.     Comments: Tenderness from the olecranon process down to the ulnar border to almost the carpal region.  Soft tissue swelling noted around olecranon.  No bruising or abrasion.  Patient keeps arm flexed at close to body resisting flexion and extension of elbow.  [place] supination is painful as well.  Skin:    General: Skin is warm and dry.  Neurological:     Mental Status: He is alert.      UC Treatments / Results  Labs (all labs ordered are listed, but only abnormal results are displayed) Labs Reviewed - No data to display  EKG   Radiology DG Forearm Right  Result Date: 03/06/2023 CLINICAL DATA:  Pain extending from  the elbow to the mid forearm after falling. EXAM: RIGHT FOREARM - 2 VIEW COMPARISON:  None Available. FINDINGS: The mineralization and alignment are normal. There is no evidence of acute fracture or dislocation. Elbow details are dictated separately. There are degenerative changes at the elbow. No significant wrist arthropathy or focal soft tissue abnormality identified. IMPRESSION: No evidence of acute fracture or dislocation in the right forearm. Electronically Signed   By: Richardean Sale M.D.   On: 03/06/2023 10:30   DG Elbow Complete Right  Result Date: 03/06/2023 CLINICAL DATA:  Pain after fall EXAM: RIGHT ELBOW - COMPLETE 4 VIEW COMPARISON:  None Available. FINDINGS: No fracture or dislocation. Preserved joint spaces and bone mineralization. Hyperostosis about the elbow. IMPRESSION: No acute osseous abnormality Electronically Signed   By: Jill Side M.D.   On: 03/06/2023 10:27    Procedures Procedures (including critical care time)  Medications Ordered in UC Medications - No data to display  Initial Impression / Assessment and Plan / UC Course  I have reviewed the triage vital signs and the nursing notes.  Pertinent labs & imaging results that were available during my care of the patient were reviewed by me and considered in my medical decision making (see chart for details).     Final Clinical Impressions(s) / UC Diagnoses   Final diagnoses:  Right arm pain  Fall, initial encounter     Discharge Instructions      Wear sling for comfort.  Remove arm from sling at least twice a day and do gentle motion exercises Use ice for 20 minutes every couple of hours Take ibuprofen 3 times a day with food See your orthopedics if you fail to improve by Monday     ED Prescriptions     Medication Sig Dispense Auth. Provider   ibuprofen (ADVIL) 800 MG tablet Take 1 tablet (800 mg total) by mouth 3 (three) times daily. 21 tablet Raylene Everts, MD      PDMP not reviewed this  encounter.   Raylene Everts, MD 03/06/23 1045

## 2023-03-12 ENCOUNTER — Ambulatory Visit
Admission: RE | Admit: 2023-03-12 | Discharge: 2023-03-12 | Disposition: A | Discharge status: Home / Self Care | Payer: BC Managed Care – PPO | Source: Ambulatory Visit | Attending: Emergency Medicine | Admitting: Emergency Medicine

## 2023-03-12 VITALS — BP 144/90 | HR 107 | Temp 98.9°F | Resp 18

## 2023-03-12 DIAGNOSIS — H65193 Other acute nonsuppurative otitis media, bilateral: Secondary | ICD-10-CM

## 2023-03-12 DIAGNOSIS — H60393 Other infective otitis externa, bilateral: Secondary | ICD-10-CM

## 2023-03-12 MED ORDER — AMOXICILLIN 500 MG PO CAPS: 500.0000 mg | ORAL_CAPSULE | Freq: Two times a day (BID) | ORAL | 0 refills | Status: AC

## 2023-03-12 MED ORDER — CIPROFLOXACIN-DEXAMETHASONE 0.3-0.1 % OT SUSP: 4.0000 [drp] | Freq: Two times a day (BID) | OTIC | 0 refills | Status: DC

## 2023-03-12 NOTE — Discharge Instructions (Signed)
Ear drops twice daily for 5 days  Amoxicillin pills twice daily for 5 days. Take with food.

## 2023-03-12 NOTE — ED Triage Notes (Signed)
Pt c/o bilateral ear pain and drainage. Started Monday but getting worse, taking ibuprofen with no relief.

## 2023-03-12 NOTE — ED Provider Notes (Signed)
[facility]    CSN: [id number] Arrival date & time: 03/12/23  0816     History   Chief Complaint Chief Complaint  Patient presents with   Ear Fullness    Severe pain in both ears. Drainage, swelling, dizziness, and hearing issues. - Entered by patient    HPI Raydon Kritzman is a 37 y.o. male.  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 Authorizing Provider  amoxicillin (AMOXIL) 500 MG capsule Take 1 capsule (500 mg total) by mouth 2 (two) times daily for 5 days. 03/12/23 03/17/23 Yes Demitrus Francisco, Wells Guiles, PA-C  ciprofloxacin-dexamethasone (CIPRODEX) OTIC suspension Place 4 drops into both ears 2 (two) times daily. 03/12/23  Yes Yamin Swingler, Wells Guiles, PA-C  EPINEPHrine 0.3 mg/0.3 mL IJ SOAJ injection Inject into the muscle. 03/28/20   [provider]  ibuprofen (ADVIL) 800 MG tablet Take 1 tablet (800 mg total) by mouth 3 (three) times daily. 03/06/23   Raylene Everts, MD    Family History Family History  Problem Relation Age of Onset   Hypertension Mother    Hypertension Father     Social History Social History   Tobacco Use   Smoking status: Never   Smokeless tobacco: Never  Vaping Use   Vaping Use: Never used  Substance Use Topics   Alcohol use: Not Currently    Comment: occasionally   Drug use: No     Allergies   Wasp venom   Review of Systems Review of Systems As per HPI  Physical Exam Triage Vital Signs ED Triage  Vitals  Enc Vitals Group     BP 03/12/23 0837 (!) 144/90     Pulse Rate 03/12/23 0837 (!) 107     Resp 03/12/23 0837 18     Temp 03/12/23 0837 98.9 F (37.2 C)     Temp Source 03/12/23 0837 Oral     SpO2 03/12/23 0837 98 %     Weight --      Height --      Head Circumference --      Peak Flow --      Pain Score 03/12/23 0838 9     Pain Loc --      Pain Edu? --      Excl. in [place]? --    No data found.  Updated Vital Signs BP (!) 144/90 (BP Location: Right Arm)   Pulse (!) 107   Temp 98.9 F (37.2 C) (Oral)   Resp 18   SpO2 98%   Physical Exam Vitals and nursing note reviewed.  Constitutional:      General: He is not in acute distress. HENT:     Right Ear: Swelling and tenderness present. No mastoid tenderness.     Left Ear: Swelling and tenderness present. No mastoid tenderness.     Ears:     Comments: Bilateral canals are  swollen and irritated, L > R.  Unable to visualize Tms. Tragal tenderness bilat. Pain with exam.    Nose: No congestion.     Mouth/Throat:     Mouth: Mucous membranes are moist.     Pharynx: Oropharynx is clear.  Eyes:     Conjunctiva/sclera: Conjunctivae normal.  Cardiovascular:     Rate and Rhythm: Normal rate and regular rhythm.     Heart sounds: Normal heart sounds.  Pulmonary:     Effort: Pulmonary effort is normal.     Breath sounds: Normal breath sounds.  Musculoskeletal:     Cervical back: Normal range of motion.  Lymphadenopathy:     Cervical: No cervical adenopathy.  Skin:    General: Skin is warm and dry.  Neurological:     Mental Status: He is alert and oriented to person, place, and time.     UC Treatments / Results  Labs (all labs ordered are listed, but only abnormal results are displayed) Labs Reviewed - No data to display  EKG  Radiology No results found.  Procedures Procedures (including critical care time)  Medications Ordered in UC Medications - No data to display  Initial Impression / Assessment and Plan  / UC Course  I have reviewed the triage vital signs and the nursing notes.  Pertinent labs & imaging results that were available during my care of the patient were reviewed by me and considered in my medical decision making (see chart for details).  Bilateral otitis externa Ciprodex twice daily for 5 days. Cannot visualize either of the membranes given severity of swelling.  Will cover for otitis media with amoxicillin twice daily for 5 days  Final Clinical Impressions(s) / UC Diagnoses   Final diagnoses:  Other acute nonsuppurative otitis media of both ears, recurrence not specified  Other infective acute otitis externa of both ears     Discharge Instructions      Ear drops twice daily for 5 days  Amoxicillin pills twice daily for 5 days. Take with food.    ED Prescriptions     Medication Sig Dispense Auth. Provider   ciprofloxacin-dexamethasone (CIPRODEX) OTIC suspension Place 4 drops into both ears 2 (two) times daily. 7.5 mL Katelee Schupp, PA-C   amoxicillin (AMOXIL) 500 MG capsule Take 1 capsule (500 mg total) by mouth 2 (two) times daily for 5 days. 10 capsule Brylie Sneath, Wells Guiles, PA-C      PDMP not reviewed this encounter.   Les Pou, [place] 03/12/23 [id number]

## 2023-03-13 ENCOUNTER — Telehealth: Payer: Self-pay

## 2023-03-13 NOTE — Telephone Encounter (Signed)
TC to f/u after yesterday's visit to [facility]. No answer; left VM to call [phone] for problems or questions. 

## 2023-09-07 IMAGING — DX DG CHEST 2V
2 series · 2 of 2 positions shown · non-contrast
Comparison: 01/15/2020

CLINICAL DATA: Cough

EXAM:
CHEST - 2 VIEW

[chest pa]
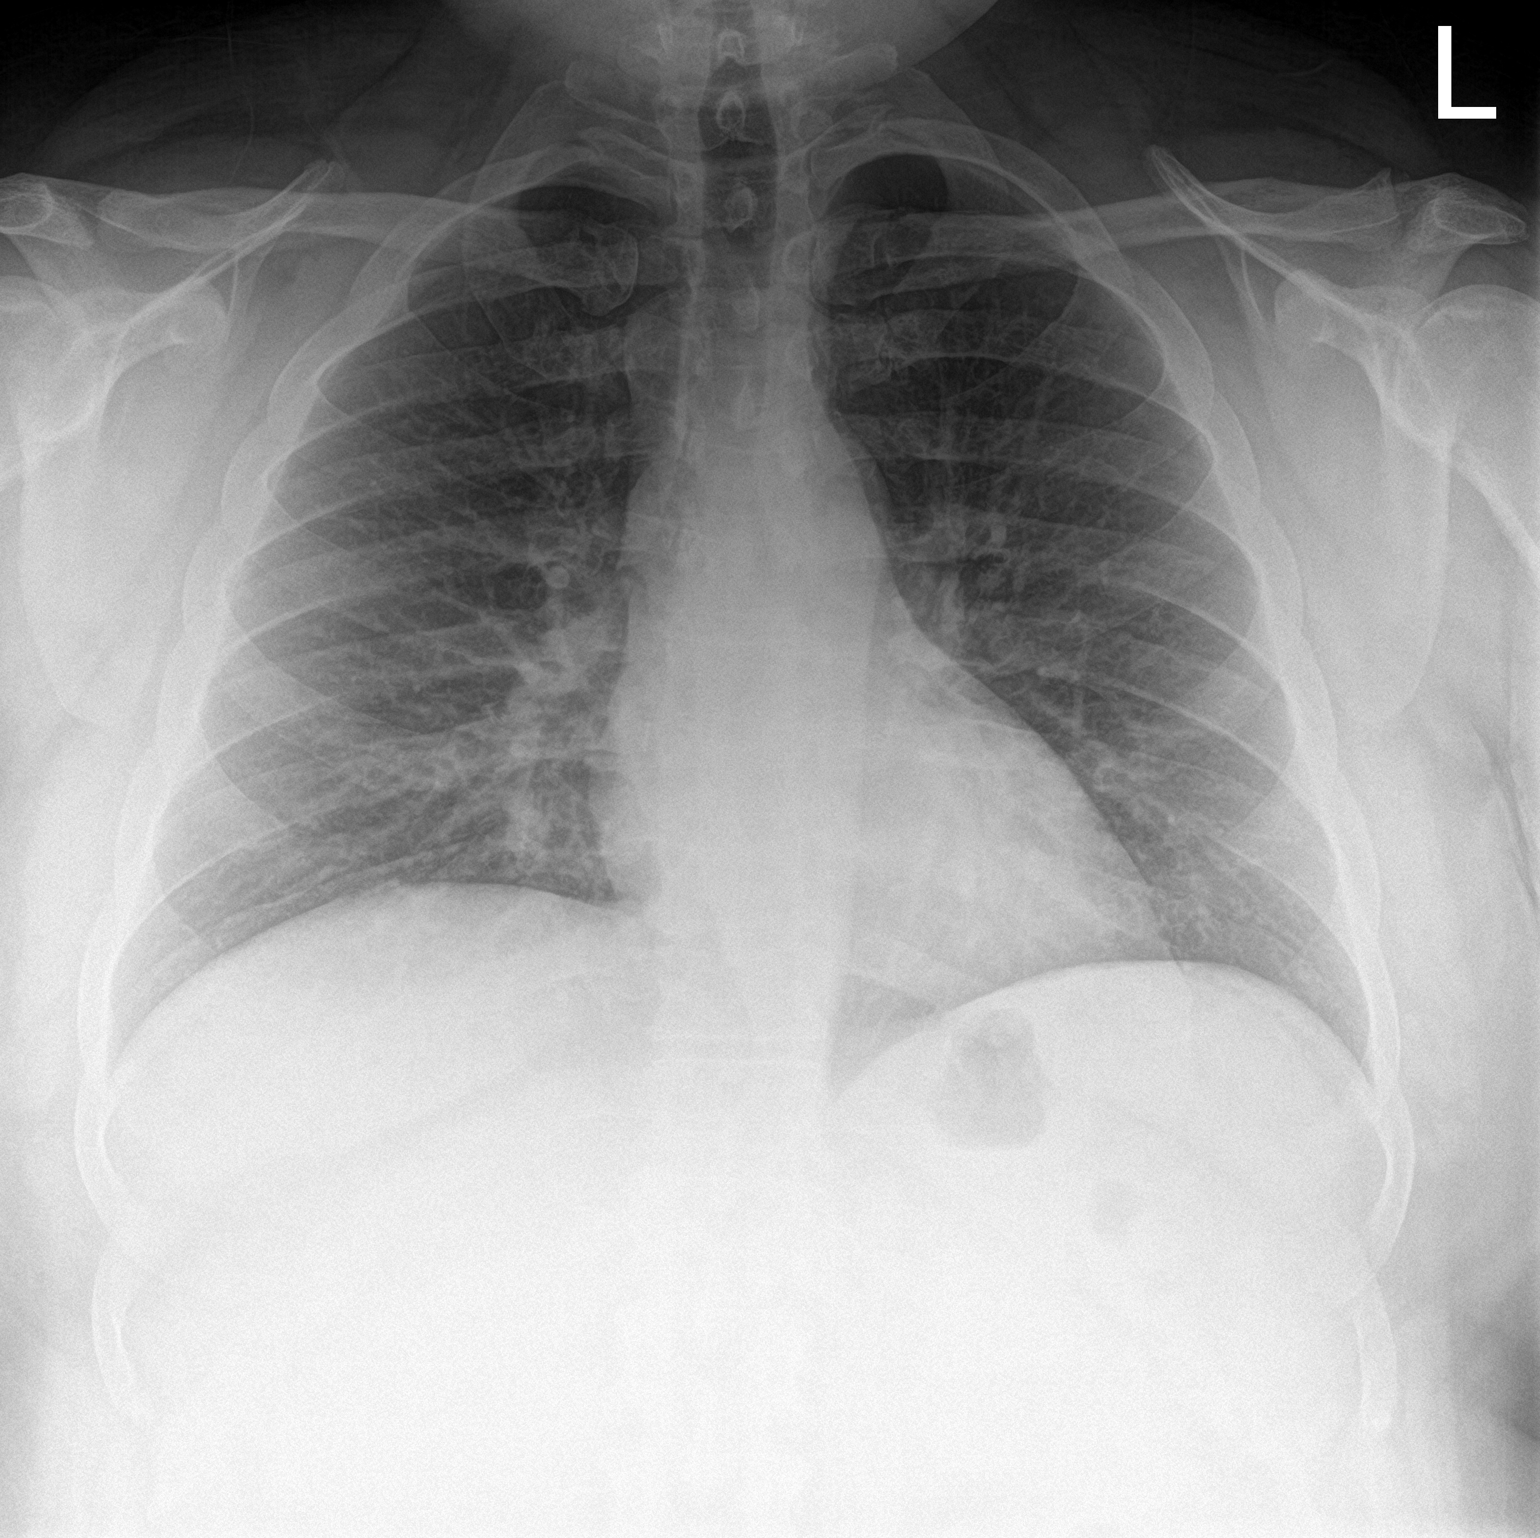

[chest lat]
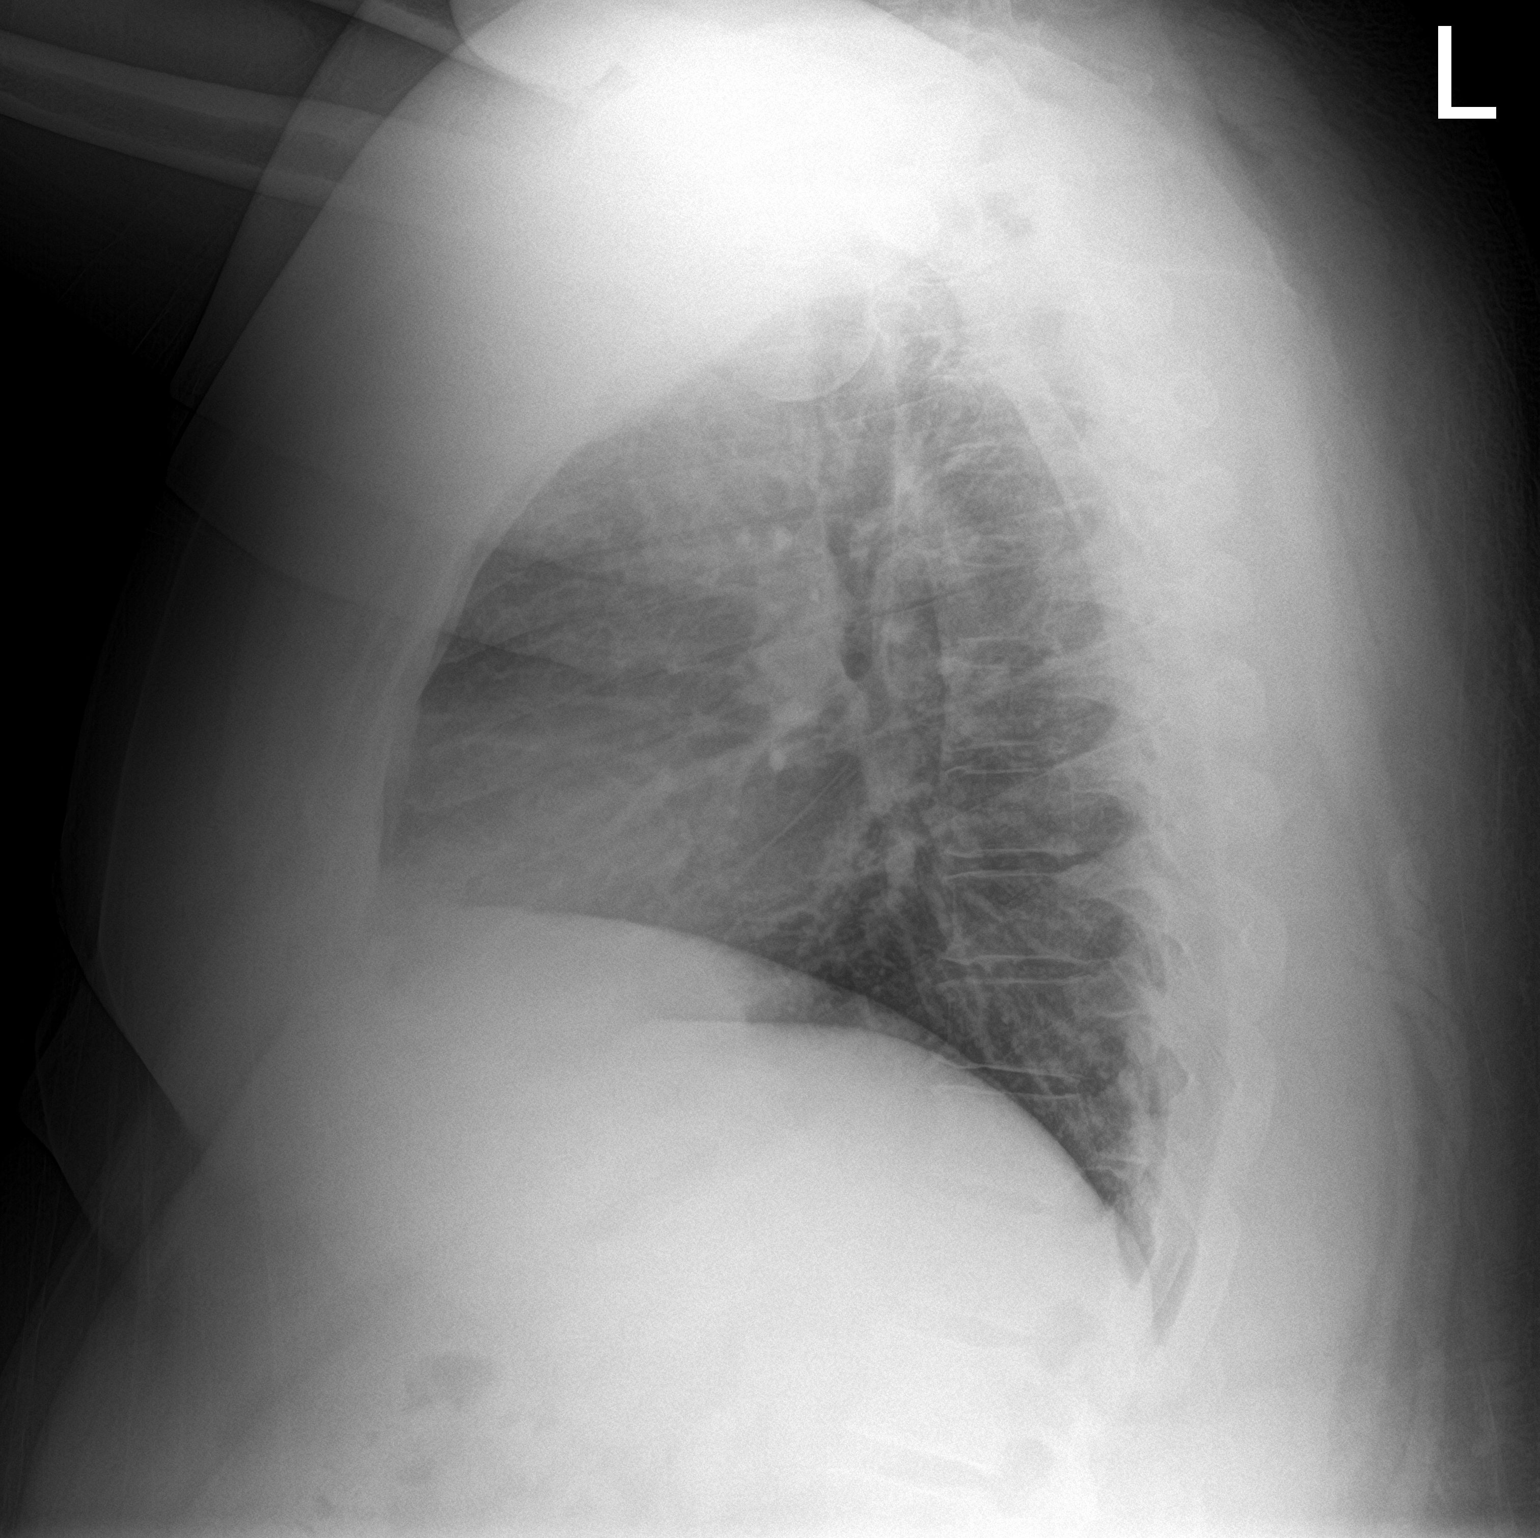

[2 of 2 positions shown; findings below may reference images not displayed]

FINDINGS: The heart size and mediastinal contours are within normal limits. No
focal airspace consolidation, pleural effusion, or pneumothorax. The
visualized skeletal structures are unremarkable.
IMPRESSION: No active cardiopulmonary disease.

## 2023-09-18 ENCOUNTER — Other Ambulatory Visit: Payer: Self-pay

## 2023-09-18 ENCOUNTER — Ambulatory Visit
Admission: EM | Admit: 2023-09-18 | Discharge: 2023-09-18 | Disposition: A | Discharge status: Home / Self Care | Payer: BC Managed Care – PPO | Attending: Internal Medicine | Admitting: Internal Medicine

## 2023-09-18 DIAGNOSIS — L738 Other specified follicular disorders: Secondary | ICD-10-CM | POA: Diagnosis not present

## 2023-09-18 MED ORDER — DOXYCYCLINE HYCLATE 100 MG PO CAPS: 100.0000 mg | ORAL_CAPSULE | Freq: Two times a day (BID) | ORAL | 0 refills | Status: AC

## 2023-09-18 NOTE — Discharge Instructions (Addendum)

## 2023-09-18 NOTE — ED Triage Notes (Signed)
Abscess to right waistline for a week, is draining clear fluid. Also reports right abdominal pain since yesterday.

## 2023-09-18 NOTE — ED Provider Notes (Signed)
[facility]    CSN: [id number] Arrival date & time: 09/18/23  0277      History   Chief Complaint Chief Complaint  Patient presents with   Abscess    HPI Glenn Johnson is a 37 y.o. male.   Glenn Johnson is a 37 y.o. male presenting for chief complaint of possible abscess to the right waistline/pubic region that started approximately 1 week ago. States the area has been intermittently draining clear fluid and yellow/pus fluid over the last week. Area is mildly tender and red. No recent antibiotic/steroid use. He has not removed hair  from area recently and denies fever, chills, and body aches. He has not attempted use of any OTC medications to help with symptoms PTA.     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 Authorizing Provider  doxycycline (VIBRAMYCIN) 100 MG capsule Take 1 capsule (100 mg total) by mouth 2 (two) times daily for 7 days. 09/18/23 09/25/23 Yes Porshia Blizzard, Donavan Burnet, FNP  ciprofloxacin-dexamethasone (CIPRODEX) OTIC suspension Place 4 drops into both ears 2 (two) times daily. 03/12/23   Rising, Lurena Joiner, PA-C  EPINEPHrine 0.3 mg/0.3 mL IJ SOAJ injection Inject into the muscle. 03/28/20   [provider]  ibuprofen (ADVIL) 800 MG tablet Take 1 tablet (800 mg total) by mouth 3 (three) times daily. 03/06/23   Eustace Moore, MD    Family History Family History  Problem Relation Age of Onset   Hypertension Mother    Hypertension Father     Social History Social History   Tobacco Use   Smoking status: Never   Smokeless tobacco: Never  Vaping Use   Vaping status:  Never Used  Substance Use Topics   Alcohol use: Not Currently    Comment: occasionally   Drug use: No     Allergies   Wasp venom   Review of Systems Review of Systems Per HPI  Physical Exam Triage Vital Signs ED Triage Vitals  Encounter Vitals Group     BP 09/18/23 0821 126/81     Systolic BP Percentile --      Diastolic BP Percentile --      Pulse Rate 09/18/23 0821 81     Resp 09/18/23 0821 16     Temp 09/18/23 0821 98 F (36.7 C)     Temp Source 09/18/23 0821 Oral     SpO2 09/18/23 0821 99 %     Weight --      Height --      Head Circumference --      Peak Flow --      Pain Score 09/18/23 0824 7     Pain Loc --      Pain Education --      Exclude from Growth Chart --    No data found.  Updated Vital Signs BP 126/81 (BP Location: Left Arm)   Pulse 81   Temp 98 F (36.7 C) (Oral)   Resp 16   SpO2 99%   Visual Acuity Right  Eye Distance:   Left Eye Distance:   Bilateral Distance:    Right Eye Near:   Left Eye Near:    Bilateral Near:     Physical Exam Vitals and nursing note reviewed.  Constitutional:      Appearance: He is not ill-appearing or toxic-appearing.  HENT:     Head: Normocephalic and atraumatic.     Right Ear: Hearing and external ear normal.     Left Ear: Hearing and external ear normal.     Nose: Nose normal.     Mouth/Throat:     Lips: Pink.  Eyes:     General: Lids are normal. Vision grossly intact. Gaze aligned appropriately.     Extraocular Movements: Extraocular movements intact.     Conjunctiva/sclera: Conjunctivae normal.  Pulmonary:     Effort: Pulmonary effort is normal.  Musculoskeletal:     Cervical back: Neck supple.  Skin:    General: Skin is warm and dry.     Capillary Refill: Capillary refill takes less than 2 seconds.     Findings: Lesion present. No rash.     Comments: Small area (2-3cm in diameter) of patchy erythema, pustular lesions, and underlying soft tissue swelling with tenderness and warmth to  palpation to the right upper pubic region.  Neurological:     General: No focal deficit present.     Mental Status: He is alert and oriented to person, place, and time. Mental status is at baseline.     Cranial Nerves: No dysarthria or facial asymmetry.  Psychiatric:        Mood and Affect: Mood normal.        Speech: Speech normal.        Behavior: Behavior normal.        Thought Content: Thought content normal.        Judgment: Judgment normal.      UC Treatments / Results  Labs (all labs ordered are listed, but only abnormal results are displayed) Labs Reviewed - No data to display  EKG   Radiology No results found.  Procedures Procedures (including critical care time)  Medications Ordered in UC Medications - No data to display  Initial Impression / Assessment and Plan / UC Course  I have reviewed the triage vital signs and the nursing notes.  Pertinent labs & imaging results that were available during my care of the patient were reviewed by me and considered in my medical decision making (see chart for details).   1. Bacterial folliculitis Presentation suspicious for bacterial folliculitis. Will treat this with doxycycline BID For 7 days, warm compresses, and tylenol as needed for pain. Follow-up if symptoms fail to improve in the next 2-3 day with use of antibiotic.  Counseled patient on potential for adverse effects with medications prescribed/recommended today, strict ER and return-to-clinic precautions discussed, patient verbalized understanding.    Final Clinical Impressions(s) / UC Diagnoses   Final diagnoses:  Bacterial folliculitis     Discharge Instructions      Your abscess has been evaluated in the clinic and appears to be infected, but is not quite ready for drainage.   I would like for you to perform warm compresses to the area frequently and continue taking over the counter medications for pain and inflammation as directed.   Start taking  antibiotic sent to pharmacy as directed. This will help reduce infection to area and help the abscess mature/drain.   If abscess does not begin to drain on its own  over the next few days and becomes softer, please return to urgent care for this to be drained appropriately.   If you have any fever, chills, nausea, vomiting, or worsening pain/swelling to the area, please return to urgent care for reevaluation.     ED Prescriptions     Medication Sig Dispense Auth. Provider   doxycycline (VIBRAMYCIN) 100 MG capsule Take 1 capsule (100 mg total) by mouth 2 (two) times daily for 7 days. 14 capsule Carlisle Beers, FNP      PDMP not reviewed this encounter.   Carlisle Beers, [place] 09/18/23 1705

## 2024-04-29 HISTORY — PX: COLON SURGERY: SHX602

## 2024-10-11 ENCOUNTER — Ambulatory Visit: Admission: EM | Admit: 2024-10-11 | Discharge: 2024-10-11 | Disposition: A | Discharge status: Home / Self Care

## 2024-10-11 ENCOUNTER — Encounter: Payer: Self-pay | Admitting: Emergency Medicine

## 2024-10-11 DIAGNOSIS — J020 Streptococcal pharyngitis: Secondary | ICD-10-CM | POA: Diagnosis not present

## 2024-10-11 DIAGNOSIS — B372 Candidiasis of skin and nail: Secondary | ICD-10-CM | POA: Diagnosis not present

## 2024-10-11 DIAGNOSIS — J039 Acute tonsillitis, unspecified: Secondary | ICD-10-CM

## 2024-10-11 LAB — POCT RAPID STREP A (OFFICE): Rapid Strep A Screen: POSITIVE — AB

## 2024-10-11 MED ORDER — AMOXICILLIN-POT CLAVULANATE 875-125 MG PO TABS: 1.0000 | ORAL_TABLET | Freq: Two times a day (BID) | ORAL | 0 refills | Status: AC

## 2024-10-11 MED ORDER — PREDNISONE 20 MG PO TABS: ORAL_TABLET | ORAL | 0 refills | Status: AC

## 2024-10-11 MED ORDER — NYSTATIN-TRIAMCINOLONE 100000-0.1 UNIT/GM-% EX CREA: TOPICAL_CREAM | CUTANEOUS | 0 refills | Status: AC

## 2024-10-11 NOTE — Discharge Instructions (Addendum)
 Advised patient take medications as directed with food to completion.  Advised patient to take prednisone  with first dose of Augmentin  for the next 5 of 10 days.  Advised patient to use Mycolog-II twice daily for the next 5 to 7 days.  Advise once rash has cleared may use OTC Zeasorb for enhanced drying.  Advised if symptoms worsen and/or unresolved please follow-up with your PCP or here for further evaluation.

## 2024-10-11 NOTE — ED Triage Notes (Signed)
 Patient c/o rash in between buttocks and genital area x 3 days.  The rash does not itch more of burning sensation.  Patient has been applying Neosporin to the area.  Patient was recently diagnosed with strep throat x 3 weeks ago, sore throat reappeared yesterday.  Patient's son also had strep throat and developed a similar rash and ended in the hospital x 3 weeks ago.

## 2024-10-11 NOTE — ED Provider Notes (Signed)
 [facility]    CSN: [id number] Arrival date & time: 10/11/24  0855      History   Chief Complaint Chief Complaint  Patient presents with   Rash    HPI Glenn Johnson is a 38 y.o. male.   HPI 38 year old male presents with rash between buttocks and genital area for 3 days.  Patient reports burning sensation.  Additionally, patient reports son diagnosed with strep throat 3 weeks ago then rash appeared which was ended up having to be hospitalized 2-2 severity of his rash with IV antibiotics.  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 Authorizing Provider  amoxicillin -clavulanate (AUGMENTIN ) 875-125 MG tablet Take 1 tablet by mouth 2 (two) times daily for 10 days. 10/11/24 10/21/24 Yes Teddy Sharper, FNP  EPINEPHrine  0.3 mg/0.3 mL IJ SOAJ injection Inject into the muscle. 03/28/20  Yes [provider]  lisinopril (ZESTRIL) 10 MG tablet Take 10 mg by mouth daily. 04/26/24  Yes [provider]  nystatin-triamcinolone (MYCOLOG II) cream Apply to affected area twice daily for 7 days, as needed 10/11/24  Yes Teddy Sharper, FNP  predniSONE  (DELTASONE ) 20 MG tablet Take 3 tabs PO daily x 5 days. 10/11/24  Yes Teddy Sharper, FNP    Family History Family History  Problem Relation Age of Onset   Hypertension Mother    Hypertension Father     Social History Social History   Tobacco Use   Smoking status: Never   Smokeless tobacco: Never  Vaping Use   Vaping status: Never Used  Substance Use Topics    Alcohol use: Not Currently    Comment: occasionally   Drug use: No     Allergies   Wasp venom   Review of Systems Review of Systems  HENT:  Positive for sore throat.   Skin:  Positive for rash.  All other systems reviewed and are negative.    Physical Exam Triage Vital Signs ED Triage Vitals  Encounter Vitals Group     BP 10/11/24 0942 124/77     Girls Systolic BP Percentile --      Girls Diastolic BP Percentile --      Boys Systolic BP Percentile --      Boys Diastolic BP Percentile --      Pulse Rate 10/11/24 0942 97     Resp 10/11/24 0942 18     Temp 10/11/24 0942 98.9 F (37.2 C)     Temp Source 10/11/24 0942 Oral     SpO2 10/11/24 0942 96 %     Weight 10/11/24 0940 (!) 318 lb (144.2 kg)     Height 10/11/24 0940 5' 6 (1.676 m)     Head Circumference --      Peak Flow --      Pain Score 10/11/24 0940 6     Pain Loc --      Pain Education --      Exclude from [employer]  Chart --    No data found.  Updated Vital Signs BP 124/77 (BP Location: Right Arm)   Pulse 97   Temp 98.9 F (37.2 C) (Oral)   Resp 18   Ht 5' 6 (1.676 m)   Wt (!) 318 lb (144.2 kg)   SpO2 96%   BMI 51.33 kg/m    Physical Exam Vitals and nursing note reviewed.  Constitutional:      Appearance: Normal appearance. He is obese.  HENT:     Head: Normocephalic and atraumatic.     Right Ear: Tympanic membrane, ear canal and external ear normal.     Left Ear: Tympanic membrane, ear canal and external ear normal.     Mouth/Throat:     Mouth: Mucous membranes are moist.     Pharynx: Oropharynx is clear. Uvula midline. Uvula swelling present.     Tonsils: Tonsillar exudate present. 4+ on the right. 4+ on the left.  Eyes:     Extraocular Movements: Extraocular movements intact.     Pupils: Pupils are equal, round, and reactive to light.  Cardiovascular:     Rate and Rhythm: Normal rate and regular rhythm.     Pulses: Normal pulses.     Heart sounds: Normal heart sounds.  Pulmonary:      Effort: Pulmonary effort is normal.     Breath sounds: Normal breath sounds. No wheezing, rhonchi or rales.  Musculoskeletal:        General: Normal range of motion.  Skin:    General: Skin is warm and dry.     Comments: Perineum: Erythematous, macerated, plaques, erosions including erythematous satellites with delicate peripheral scaling and papulopustules-please see image below   Neurological:     General: No focal deficit present.     Mental Status: He is alert and oriented to person, place, and time. Mental status is at baseline.  Psychiatric:        Mood and Affect: Mood normal.        Behavior: Behavior normal.      UC Treatments / Results  Labs (all labs ordered are listed, but only abnormal results are displayed) Labs Reviewed  POCT RAPID STREP A (OFFICE) - Abnormal; Notable for the following components:      Result Value   Rapid Strep A Screen Positive (*)    All other components within normal limits    EKG   Radiology No results found.  Procedures Procedures (including critical care time)  Medications Ordered in UC Medications - No data to display  Initial Impression / Assessment and Plan / UC Course  I have reviewed the triage vital signs and the nursing notes.  Pertinent labs & imaging results that were available during my care of the patient were reviewed by me and considered in my medical decision making (see chart for details).     MDM: 1.  Strep pharyngitis-Rx'd Augmentin  875/125 mg tablet: Take 1 tablet twice daily x 10 days; 2. Acute tonsillitis, Rx'd Augmentin  875/125 mg tablet: Take 1 tablet twice daily x 10 days, Rx'd prednisone  20 mg tablet: Take 3 tablets p.o. daily x 5 days. Advised patient take medications as directed with food to completion.  Advised patient to take prednisone  with first dose of Augmentin  for the next 5 of 10 days.  Advised patient to use Mycolog-II twice daily for the next 5 to 7 days.  Advise once rash has cleared may use OTC  Zeasorb for enhanced drying.  Advised if symptoms worsen and/or  unresolved please follow-up with your PCP or here for further evaluation.  Patient discharged home, hemodynamically stable.  Work note provided to patient prior to discharge. Final Clinical Impressions(s) / UC Diagnoses   Final diagnoses:  Strep pharyngitis  Acute tonsillitis, unspecified etiology  Candidal intertrigo     Discharge Instructions      Advised patient take medications as directed with food to completion.  Advised patient to take prednisone  with first dose of Augmentin  for the next 5 of 10 days.  Advised patient to use Mycolog-II twice daily for the next 5 to 7 days.  Advise once rash has cleared may use OTC Zeasorb for enhanced drying.  Advised if symptoms worsen and/or unresolved please follow-up with your PCP or here for further evaluation.     ED Prescriptions     Medication Sig Dispense Auth. Provider   nystatin-triamcinolone (MYCOLOG II) cream Apply to affected area twice daily for 7 days, as needed 30 g Adlai Nieblas, FNP   amoxicillin -clavulanate (AUGMENTIN ) 875-125 MG tablet Take 1 tablet by mouth 2 (two) times daily for 10 days. 20 tablet Lafreda Casebeer, FNP   predniSONE  (DELTASONE ) 20 MG tablet Take 3 tabs PO daily x 5 days. 15 tablet Romanda Turrubiates, FNP      PDMP not reviewed this encounter.   Teddy Sharper, FNP 10/11/24 1036

## 2024-10-12 ENCOUNTER — Telehealth: Payer: Self-pay

## 2024-10-12 NOTE — Telephone Encounter (Signed)
 Called patient per patient call back order. Pt reports feeling better
